# Patient Record
Sex: Female | Born: 1950 | Race: White | Hispanic: No | Marital: Married
Health system: Midwestern US, Community
[De-identification: ages and names within clinical notes are randomized; demographics above are authoritative.]

## PROBLEM LIST (undated history)

## (undated) DIAGNOSIS — R2242 Localized swelling, mass and lump, left lower limb: Secondary | ICD-10-CM

## (undated) DIAGNOSIS — D649 Anemia, unspecified: Secondary | ICD-10-CM

## (undated) DIAGNOSIS — D7589 Other specified diseases of blood and blood-forming organs: Secondary | ICD-10-CM

## (undated) DIAGNOSIS — R300 Dysuria: Secondary | ICD-10-CM

## (undated) DIAGNOSIS — L299 Pruritus, unspecified: Secondary | ICD-10-CM

## (undated) DIAGNOSIS — I2699 Other pulmonary embolism without acute cor pulmonale: Secondary | ICD-10-CM

## (undated) DIAGNOSIS — I4891 Unspecified atrial fibrillation: Secondary | ICD-10-CM

## (undated) HISTORY — PX: CHOLECYSTECTOMY: SHX55

## (undated) HISTORY — PX: ABDOMINAL HYSTERECTOMY: SHX81

## (undated) HISTORY — PX: JOINT REPLACEMENT: SHX530

## (undated) HISTORY — PX: KNEE ARTHROSCOPY: SUR90

---

## 2015-09-25 ENCOUNTER — Emergency Department
Admission: EM | Admit: 2015-09-25 | Discharge: 2015-09-25 | Disposition: A | Discharge status: Home / Self Care | Payer: Managed Care, Other (non HMO) | Attending: Emergency Medicine | Admitting: Emergency Medicine

## 2015-09-25 ENCOUNTER — Emergency Department: Payer: Managed Care, Other (non HMO)

## 2015-09-25 DIAGNOSIS — R609 Edema, unspecified: Secondary | ICD-10-CM

## 2015-09-25 DIAGNOSIS — Z880 Allergy status to penicillin: Secondary | ICD-10-CM | POA: Insufficient documentation

## 2015-09-25 DIAGNOSIS — M79605 Pain in left leg: Secondary | ICD-10-CM | POA: Insufficient documentation

## 2015-09-25 DIAGNOSIS — R2242 Localized swelling, mass and lump, left lower limb: Secondary | ICD-10-CM | POA: Diagnosis present

## 2015-09-25 HISTORY — DX: Unspecified atrial fibrillation: I48.91

## 2015-09-25 HISTORY — DX: Other pulmonary embolism without acute cor pulmonale: I26.99

## 2015-09-25 MED ORDER — CLINDAMYCIN HCL 300 MG PO CAPS: 300.0000 mg | ORAL_CAPSULE | Freq: Three times a day (TID) | ORAL | Status: AC

## 2015-09-25 MED ORDER — OXYCODONE-ACETAMINOPHEN 5-325 MG PO TABS
2.0000 | ORAL_TABLET | Freq: Once | ORAL | Status: AC
  Administered 2015-09-25: 2 via ORAL
  Filled 2015-09-25: qty 2

## 2015-09-25 MED ORDER — CLINDAMYCIN HCL 150 MG PO CAPS
300.0000 mg | ORAL_CAPSULE | Freq: Once | ORAL | Status: AC
  Administered 2015-09-25: 300 mg via ORAL
  Filled 2015-09-25: qty 2

## 2015-09-25 MED ORDER — OXYCODONE-ACETAMINOPHEN 5-325 MG PO TABS: 1.0000 | ORAL_TABLET | Freq: Four times a day (QID) | ORAL | Status: AC | PRN

## 2015-09-25 NOTE — ED Notes (Signed)
Patient transported to Ultrasound 

## 2015-09-25 NOTE — ED Notes (Signed)
Pt c/o left lower leg pain and swelling that started today.. States she has hx of PE in the past

## 2015-09-25 NOTE — Discharge Instructions (Signed)

## 2015-09-25 NOTE — ED Provider Notes (Signed)
[facility] Emergency Department Provider Note  Time seen: 5:57 PM  I have reviewed the triage vital signs and the nursing notes.   HISTORY  Chief Complaint Leg Swelling    HPI Lindsey Hawkins is a 64 y.o. 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 According to the patient she is traveling to [place] from [place]. After the long plane ride she noticed some pain to her legs, the leg is now more swollen and painful. She states occasional swelling in the legs but usually not to this degree. Patient took Lasix today, but came to the emergency department because she was worried this could be a blood clot. Denies any chest pain or shortness of breath. Denies any weakness or numbness. Denies any pain in the right lower extremity.     Past Medical History  Diagnosis Date  . A-fib (HCC)   . PE (pulmonary embolism)     There are no active problems to display for this patient.   Past Surgical History  Procedure Laterality Date  . Joint replacement      TKR  . Abdominal hysterectomy    . Cholecystectomy    . Knee arthroscopy Bilateral     No current outpatient prescriptions on file.  Allergies Penicillins and Sulfa antibiotics  No family history on file.  Social History Social History  Substance Use Topics  . Smoking status: Never Smoker   . Smokeless tobacco: None  . Alcohol Use: Yes    Review of Systems Constitutional: Negative for fever. Cardiovascular: Negative for chest pain. Respiratory: Negative for shortness of breath. Gastrointestinal: Negative for abdominal pain Musculoskeletal: Left lower extremity pain and swelling. Skin: Negative for rash. Neurological: Negative for headache 10-point ROS otherwise  negative.  ____________________________________________   PHYSICAL EXAM:  VITAL SIGNS: ED Triage Vitals  Enc Vitals Group     BP 09/25/15 1634 164/57 mmHg     Pulse Rate 09/25/15 1634 73     Resp 09/25/15 1634 18     Temp 09/25/15 1634 97.9 F (36.6 C)     Temp Source 09/25/15 1634 Oral     SpO2 09/25/15 1634 100 %     Weight 09/25/15 1634 165 lb (74.844 kg)     Height 09/25/15 1634  (1.626 m)     Head Cir --      Peak Flow --      Pain Score 09/25/15 1635 7     Pain Loc --      Pain Edu? --      Excl. in GC? --     Constitutional: Alert and oriented. Well appearing and in no distress. Eyes: Normal exam ENT   Head: Normocephalic and atraumatic.   Mouth/Throat: Mucous membranes are moist. Cardiovascular: Normal rate, regular rhythm. No murmur Respiratory: Normal respiratory effort without tachypnea nor retractions. Breath sounds are clear Gastrointestinal: Soft and nontender. No distention.   Musculoskeletal: Moderate left lower extremity tenderness to palpation. Moderate edema of the left lower extremity. No erythema noted. 2+ DP pulse. Sensation intact. Neurologic:  Normal speech and language. No gross focal neurologic deficit Skin:  Skin is warm, dry and intact.  Psychiatric: Mood and affect are normal. Speech and behavior are normal.   ____________________________________________   RADIOLOGY  Ultrasound shows no DVT  ____________________________________________   INITIAL IMPRESSION / ASSESSMENT  AND PLAN / ED COURSE  Pertinent labs & imaging results that were available during my care of the patient were reviewed by me and considered in my medical decision making (see chart for details).  Patient with left lower extremity swelling and tenderness palpation. Suspect early cellulitis versus DVT. No erythema on exam. Left lower extremity is moderately swollen compared to the right. No fever. Denies chest pain or shortness of breath.  Ultrasound was  negative for DVT. Given the patient's swelling with tenderness to palpation I recommended compression stockings, elevation while at rest, but to remain mobile. We will also prescribe clindamycin to cover for any possible early cellulitis, as the patient has a history of bacteremia in the past. Patient is agreeable to this plan and plans to follow-up with her primary care physician on Wednesday once [place].  ____________________________________________   FINAL CLINICAL IMPRESSION(S) / ED DIAGNOSES  Left lower extremity pain Left lower extremity swelling   Minna AntisKevin Anecia Nusbaum, MD 09/25/15 1806

## 2016-03-07 NOTE — Op Note (Signed)
Operative Report    [facility]  Cindy CascoW. Tyshia Fenter, MD  Service Date: 03/07/2016    SURGEON:  Dr. Alcide GoodnessWorthington.      ASSISTANT:  None.      PREOPERATIVE DIAGNOSIS:  L1 compression fracture.      POSTOPERATIVE DIAGNOSIS:  L1 compression fracture.      PROCEDURE PERFORMED:  Percutaneous vertebral augmentation L1  (kyphoplasty with bone biopsy and fluoroscopy).      OPERATIVE NOTE:  After induction of general endotracheal anesthesia,  the patient was positioned prone on bolsters on the flat top of the  [place] frame.  AP and lateral fluoroscopy was used through the entire  case, they were put in position so as to visualize the L1 vertebral  body.  The upper lumbar region was prepped and draped into a sterile  field with a sky drape.  Local infiltration was carried out at the 2  entry points which were positioned by fluoroscopy just superolateral  to the pedicles on each side at L1.  A stab incision was made on each  side, Kyphon needle was introduced and directed using AP and lateral  fluoro via the pedicle into the vertebral body.  This was accomplished  on each side.  A bone biopsy was taken from the right without  difficulty.  Drilling was carried out on both sides.  Balloon  catheters were placed and the balloons inflated.  Balloons were  deflated and the Kyphon cement was then placed in each side with a  good fill.  When some extravasation was noted on the right, the  procedure was stopped again with a good fill particularly at the  superior endplate.  The cement was tamped down and the Kyphon needles  removed and a suture placed in each stab incision.      ESTIMATED BLOOD LOSS:  Negligible.      SPECIMEN: Bone biopsy L1.      Cindy CascoW. Nayab Aten, MD  TR: *n DD: 03/07/2016 13:24 TD: 03/07/2016 14:32 Job#: [id number]  \\[id number]\\DOC#: [id number]  \\[id number]\\  Signature Line    Electronically Signed on 03/10/2016 06:49 AM EDT  ________________________________________________  Marzella SchleinWORTHINGTON-MD,  Cindy Buck

## 2016-03-07 NOTE — Nursing Note (Signed)
Medication Administration Follow Up-Text       Medication Administration Follow Up Entered On:  03/07/2016 13:51 EDT    Performed On:  03/07/2016 13:50 EDT by Tanna FurryOLLEY, RN, ELLIE D      Intervention Information:     hydromorphone  Performed by Tanna FurryOLLEY, RN, ELLIE D on 03/07/2016 13:35:00 EDT       hydromorphone,0.5mg   IV Push,Forearm, Mid Left,other (see comment)       Medication Effectiveness Evaluation   Medication Administration Reason :   Pain   Medication Effective :   Yes   Medication Response :   Symptoms improved, Continue to observe for symptoms   COLLEY, RN, ELLIE D - 03/07/2016 13:51 EDT

## 2016-03-07 NOTE — Procedures (Signed)
 IntraOp Record - [facility]             IntraOp Record - [facility] Summary                                                                   Primary Physician:        ALVAN BRASIL    Case Number:              [id number]    Finalized Date/Time:      03/07/16 13:04:31    Pt. Name:                 Cindy Buck, Cindy Buck    D.O.B./Sex:               [birth year]    Female    Med Rec #:                [id number]    Physician:                ALVAN BRASIL    Financial #:              [id number]    Pt. Type:                 R    Room/Bed:                 /    Admit/Disch:              03/07/16 09:03:00 -    Institution:       [facility] - Case Times                                                                                                         Entry 1                                                                                                          Patient      In Room Time             03/07/16 11:49:00               Out Room Time                   03/07/16 13:04:00    Anesthesia     Procedure  Start Time               03/07/16 12:30:00               Stop Time                       03/07/16 12:57:00    Last Modified By:         Glendora, RN, Eric                              03/07/16 13:04:18      [facility] - Case Times Audit                                                                          03/07/16 13:04:18         Owner: MADONNA                               Modifier: HASTER                                                        <+> 1         Out Room Time        <+> 1         Stop Time     03/07/16 12:31:06         Owner: RECARDO                               Modifier: ESTEJO                                                        <+> 1         Start Time        [facility] - Safety Checklist - Sign In                                                                                         Entry 1  History/Physical on       Yes                             Procedure Consent               Yes    Chart                                                     on Chart     Site Marked (if           Yes    applicable)     Last Modified By:         ESTES, RN, JOANNA D                              03/07/16 12:13:03      [facility] - Case Attendance                                                                                                    Entry 1                         Entry 2                         Entry 3                                          Case Attendee             FERLA-MD,  BRIAN P              WORTHINGTON-MD,  TOD SHEEN, RN, PHILIPPE D    Role Performed            Anesthesiologist                Surgeon Primary                 Circulator Relief    Time In                   03/07/16 11:49:00               03/07/16 11:49:00               03/07/16 11:49:00    Time Out                  03/07/16 13:04:00               03/07/16 13:04:00               03/07/16 12:30:00  Procedure                 Kyphoplasty                     Kyphoplasty                     Kyphoplasty    Last Modified By:         Glendora, RN, Camellia Glendora, RN, Camellia Glendora, RN, Camellia                              03/07/16 13:04:26               03/07/16 13:04:26               03/07/16 13:04:26                                Entry 4                         Entry 5                         Entry 6                                          Case Attendee             HASTINGS, RN, ERIC R            DEBBY, RN, WAYLAN JAYSON KAPUR, RN, JENNIFER A    Role Performed            Circulator                      Surgical Scrub Relief           Surgical Scrub    Time In                   03/07/16 11:49:00               03/07/16 12:00:00               03/07/16 11:49:00    Time Out                  03/07/16 13:04:00               03/07/16 12:50:00               03/07/16 12:10:00    Procedure                 Kyphoplasty                      Kyphoplasty                     Kyphoplasty    Last Modified By:         Glendora RN, Camellia  Glendora, RN, Camellia Glendora, RN, Camellia                              03/07/16 13:04:26               03/07/16 13:04:26               03/07/16 13:04:26      [facility] - Case Attendance Audit                                                                     03/07/16 13:04:26         Owner: RECARDO                               Modifier: HASTER                                                            1     <+> Time Out            1     <*> Procedure                              Kyphoplasty            2     <+> Time Out            2     <*> Procedure                              Kyphoplasty            3     <*> Procedure                              Kyphoplasty            4     <+> Time Out            4     <*> Procedure                              Kyphoplasty            5     <*> Procedure                              Kyphoplasty            6     <*> Procedure                              Kyphoplasty  03/07/16 12:56:51         Owner: MADONNA                               Modifier: HASTER                                                            3     <+> Time Out            3     <*> Procedure                              Kyphoplasty            5     <+> Time Out            5     <*> Procedure                              Kyphoplasty     03/07/16 12:32:56         Owner: ESTEJO                               Modifier: ESTEJO                                                        <+> 1         Procedure        <+> 2         Procedure            3     <+> Time In            3     <*> Procedure                              Kyphoplasty            4     <+> Time In            4     <*> Procedure                              Kyphoplasty            5     <*> Procedure                              Kyphoplasty            6     <*> Procedure                              Kyphoplasty     03/07/16  12:12:24  Owner: HASTER                               Modifier: ESTEJO                                                        <+> 1         Time In        <+> 2         Time In        <+> 3         Case Attendee        <+> 3         Role Performed        <+> 3         Procedure        <+> 4         Case Attendee        <+> 4         Role Performed        <+> 4         Procedure        <+> 5         Case Attendee        <+> 5         Role Performed        <+> 5         Time In        <+> 5         Procedure        <+> 6         Case Attendee        <+> 6         Role Performed        <+> 6         Time In        <+> 6         Time Out        <+> 6         Procedure        [facility] - Skin Assessment                                                                          Pre-Care Text:            A.240 Assesses baseline skin condition Im.120 Implements protective measures to prevent skin or tissue injury           due to mechanical sources  Im.280.1 Implements progective measures to prevent skin or tissue injury due to           thermal sources Im.360 Monitors for signs and symptons of infection                              Entry 1  Skin Integrity            Intact    Last Modified By:         LISETTE OBIE CRAZE D                              03/07/16 12:17:55    Post-Care Text:            E.10 Evaluates for signs and symptoms of physical injury to skin and tissue E.270 Evaluate tissue perfusion           O.60 Patient is free from signs and symptoms of injury caused by extraneous objects   O.210 Patinet's tissue           perfusion is consistent with or improved from baseline levels      [facility] - Patient Positioning                                                                      Pre-Care Text:            A.240 Assesses baseline skin condition A.280 Identifies baseline musculoskeletal status A.280.1 Identifies            physical alterations that require additional precautions for procedure-specific positioning A.510.8 Maintains           patient's dignity and privacy Im.120 Implements protective measures to prevent skin/tissue injury due to           mechanical sources Im.40 Positions the patient Im.80 Applies safety devices                              Entry 1                                                                                                          Procedure                 Kyphoplasty                     Body Position                   Prone    Left Arm Position         Flexed on Padded Arm            Right Arm Position              Flexed on Padded Arm                              Board w/Security Strap  Board w/Security Strap    Left Leg Position         Extended Security               Right Leg Position              Extended Security                              Strap, Pillow Under                                             Strap, Pillow Under                              Lower Leg                                                       Lower Leg    Feet Uncrossed            Yes                             Pressure Points                 Yes                                                              Checked     Positioning Device        Pillow, Gel Roll, Foam          Positioned By                   FERLA-MD,  BRIAN P,                              Padding, Arm Cradle                                             HASTINGS, RN, ERIC R,                              Foam/Gel, Table                                                 WORTHINGTON-MD,  TOD Mace, Safety Strap    Outcome Met (O.80)  Yes    Last Modified By:         LISETTE OBIE CRAZE D                              03/07/16 12:17:43    Post-Care Text:            A.240 Assesses baseline skin condition A.280 Identifies baseline musculoskeletal status A.280.1 Identifies            physical alterations that require additional precautions for procedure-specific positioning A.510.8 Maintains           patient's dignity and privacy Im.120 Implements protective measures to prevent skin/tissue injury due to           mechanical sources Im.40 Positions the patient Im.80 Applies safety devices      [facility] - Skin Prep                                                                                Pre-Care Text:            A.30 Verifies allergies A.20 Verifies procedure, surgical site, and laterality A.510.8 Maintains paritnet's           dignity and privacy Im.270 Performs Skin Preparation Im.270.1 Implements protective measures to prevent skin           and tissue injury due to chemical sources  A.300.1 Protects from cross-contamination                              Entry 1                                                                                                          Hair Removal     Skin Prep      Prep Agents (Im.270)     Chlorhexidine Gluconate         Prep Area (Im.270)              Back                              2% w/Alcohol     Prep By                  ALVAN BRASIL    Outcome Met (O.100)       Yes    Last Modified By:         Glendora, RN, Camellia  03/07/16 12:51:37    Post-Care Text:            E.10 Evaluates for signs and symptoms of physical injury to skin and tissue O.100 Patient is free from signs           and symptoms of chemical injury  O.740 The patient's right to privacy is maintained      [facility] - Counts Initial and Final                                                                 Pre-Care Text:            A.20.2 - Assesses the risk for unintended retained surgical items Im.20 - Performs required counts                              Entry 1                                                                                                          Initial Counts      Initial Counts           HASTINGS, RN, ERIC R,           Items included in                Sponges, Sharps     Performed By             [employer], RN, JENNIFER A          the Initial Count     Final Counts      Final Counts             HASTINGS, RN, ERIC R,           Final Count Status              Correct     Performed By             SHERON, RN, JENNIFER A     Items Included in        Sponges, Sharps     Final Count     Outcome Met (O.20)        Yes    Last Modified By:         Glendora, RN, Eric                              03/07/16 12:53:20    Post-Care Text:            E.50 - Evaluates results of the surgical count O.20 - Patient is free from unintended retained surgical items      [facility] - Counts Initial and Final Audit  03/07/16 12:53:20         Owner: MADONNA                               Modifier: HASTER                                                        <+> 1         Final Counts Performed By        <+> 1         Final Count Status        <+> 1         Items Included in Final Count        <+> 1         Outcome Met (O.20)        [facility] - General Case Data                                                                        Pre-Care Text:            A.350.1 Classifies surgical wound                              Entry 1                                                                                                          Case Information      ASA Class                2                               Case Level                      Level 3     OR                       RH7 07                          Specialty                       Neurosurgery (SN)     Wound Class              1-Clean    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    Last Modified By:         ESTES, RN, JOANNA D                              03/07/16 12:12:31    Post-Care Text:            O.760 Patient receives consistent and comparable care regardless of the setting      [facility] - Fire Risk Assessment                                                                                                Entry 1                                                                                                          Fire Risk                 Alcohol Based Prep              Fire Risk Score                 2    Assessment: If            Solution, Ignition    checked, checkmark        Source In Use    = 1 point     Last Modified By:         ESTES, RN, JOANNA D                              03/07/16 12:31:44      [facility] - Safety Checklist - Sign Out                                                              Pre-Care Text:            Im.330 Manages specimen handling and disposition                              Entry 1  Patient Safety            Yes    Communication Guide     Used Throughout Case     Last Modified By:         Glendora, RN, Eric                              03/07/16 12:51:43    Post-Care Text:            E.800 Ensures continuity of care E.50 Evaluates results of the surgical count O.30 Patient's procedure is           performed on the correct site, side, and level O.50 patient's current status is communicated throughout the           continuum of care O.40 Patient's specimen(s) is managed in the appropriate manner      [facility] - Patient Care Devices                                                                     Pre-Care Text:            A.200 Assesses risk for normothermia regulation A.40 Verifies presence of prosthetics or corrective devices           Im.280 Implements thermoregulation measures Im.60 Uses supplies and equipment within safe parameters                              Entry 1                         Entry 2                                                                          Equipment Type            MACHINE SEQUENTIAL              BAIR HUGGER                              COMPRESSION    SCD Sleeve Site            Legs Bilateral    Equipment/Tag Number      [id number]                           [id number]    Initiated Pre             Yes    Induction     Last Modified By:         LISETTE, RN, JOANNA D             ESTES, RN, JOANNA D  03/07/16 12:16:42               03/07/16 12:16:42    Post-Care Text:            E.10 Evaluates signs and symptoms of physical injury to skin and tissue O.60 Patient is free from signs and           symptoms of injury caused by extraneous objects      [facility] - Medications                                                                              Pre-Care Text:            A.10 Confirms patient identity A.30 Verifies allergies Im.220 Administers prescribed medications Im.220.2           Administers prescribed antibiotic therapy as ordered                              Entry 1                                                                                                          Time Administered         03/07/16 12:31:00               Medication                      BUPIVACAINE 0.25%                                                                                              EPINEPHRINE INJECTION                                                                                                 Route of Admin            Local Injection  Dose/Volume                                                                                  (include amount and                                                               unit of measure)     Site                      Back                            Site Detail                     Bilateral    Administered By           ALVAN BRASIL         Outcome Met (O.130)             Yes    Last Modified By:         ESTES, RN, JOANNA D                              03/07/16 12:31:35    Post-Care Text:            E.20 Evaluates response to medications O.130 Patient receives appropriately administerd medication(s)      [facility] -  Specimens                                                                                                          Entry 1                                                                                                          Description               L1 BONE BIOPSY  Specimen Type                   Routine    Last Modified By:         Glendora, RN, Eric                              03/07/16 12:39:59      [facility] - Implants/Endoscopy Stents                                                                Pre-Care Text:            A.20 Verifies operative procedure, surgical site, and laterality A.20.1 Verifies consent for planned procedure           Im.350 Records implants inserted during the operative or invasive procedure                              Entry 1                                                                                                          Implant/Explant           Implant                         Catalog #                      [id number]    Implant     Identification      Description              [facility] HV-R BONE CEMENT          Expiration Date                 08/06/18                              AND MIXER C01B     Lot Number               [id number]                         Manufacturer                    [employer]    Usage Data      Implant Site             LUMBAR BACK                     Quantity  1    Last Modified By:         Glendora, RN, Eric                              03/07/16 12:36:47    Post-Care Text:            E.30 Evaluates verification process for correct patient, site, side and level surgery O.30 Patient's procedure           is performed on the correct site, side, and level      [facility] - Implants/Endoscopy Stents Audit                                                           03/07/16 12:36:47         Owner: MADONNA                               Modifier: HASTER                                                            1     <*> Description                             [employer] HV-R BONE CEMENT AND MIXER C01B        [facility] - Dressing/Packing                                                                         Pre-Care Text:            A.350 Assesses susceptibility for infection Im.250 Administers care to invasive devices Im.290 Administer care           to wound sites  Im.300 Implements aseptic technique                              Entry 1                                                                                                          Site                      Back  Dressing Item     Details      Dressing Item            Non-Adhesive Dressing,     (Im.290)                 Band-Aid    Last Modified By:         Glendora, RN, Eric                              03/07/16 12:40:59    Post-Care Text:            E.320 Evaluate factors associted with increased risk for postoperative infection at the completion of the           procedure O.200 Patient's wound perfusion is consistent with or improved from baseline levels  O.Patient is           free from signs and symptoms of infection      [facility] - Procedures                                                                               Pre-Care Text:            A.20 Verifies operative procedure, surgical site, and laterality Im.150 Develops individualized plan of care                              Entry 1                                                                                                          Procedure     Description      Procedure                Kyphoplasty                     Surgical Procedure              KYPHOPLASTY L1 [facility]                                                              Text     Primary Procedure         Yes                             Primary Surgeon  ALVAN BRASIL    Start                     03/07/16 12:30:00               Stop                            03/07/16 12:57:00    Anesthesia Type           General                         Surgical Service                 Neurosurgery (SN)    Wound Class               1-Clean    Last Modified By:         Glendora, RN, Eric                              03/07/16 13:04:20    Post-Care Text:            O.730 The patinet's care is consistent with the individualized perioperative plan of care      [facility] - Procedures Audit                                                                          03/07/16 13:04:20         Owner: ESTEJO                               Modifier: HASTER                                                        <+> 1         Stop        [facility] - Safety Checklist - Time Out                                                              Pre-Care Text:            A.10 Confirms patient identity A.20 Verifies operative procedure, surgical site, and laterality A.20.1 Verifies           consent for planned procedure A.30 Verifies allergies                              Entry 1  Surgical/Procedure        Yes                             Time Out Complete               03/07/16 12:19:00    Team confirms     correct patient,     correct site and     correct procedure     Last Modified By:         LISETTE OBIE CRAZE D                              03/07/16 12:23:11    Post-Care Text:            E.30 Evaluates verification process for correct patient, site, side, and level surgery      [facility] - Transfer                                                                                                           Entry 1                                                                                                          Transferred By            REGINALD REDELL SQUIBB,             Via                             Stretcher                              GLENDORA, RN, ERIC R    Post-op Destination       PACU    Skin Assessment      Condition                Intact    Last Modified By:         GLENDORA, RN, Eric                               03/07/16 12:51:21      Case Comments                                                                                         <  None>              Finalized By: Glendora, RN, Eric      Document Signatures                                                                             Signed By:           Glendora, RN, Eric 03/07/16 13:04

## 2016-03-07 NOTE — Discharge Summary (Signed)
 Inpatient Clinical Summary             [facility]  Post-Acute Care Transfer Instructions  PERSON INFORMATION   Name: Cindy Buck, Cindy Buck   MRN: [id number]    FIN#: [id number]   PHYSICIANS  Admitting Physician: ALVAN BRASIL  Attending Physician: ALVAN BRASIL   PCP: Pcp, None  Discharge Diagnosis:   Comment:       PATIENT EDUCATION INFORMATION  Instructions:             Kyphoplasty, Discharge Instructions; Anesthesia: After Your Surgery  Medication Leaflets:               Follow-up:                          With: Address: When:   [facility] [address], [address]  [place], [place]  [address]  [phone] Business (1)    Comments:   Appointment Scheduled       With: Address: When:   None Pcp                             MEDICATION LIST  Medication Reconciliation at Discharge:         Medications That Have Not Changed  Other Medications  acetaminophen-oxyCODONE (oxyCODONE-acetaminophen 5 mg-325 mg oral tablet range dose) as needed for moderate pain., MAX DAILY DOSE OF ACETAMINOPHEN = 3000 MG  Last Dose:____________________  apixaban (Eliquis 5 mg oral tablet) 1 Tabs Oral (given by mouth) 2 times a day.  Last Dose:____________________  azilsartan (Edarbi 40 mg oral tablet) 1 Tabs Oral (given by mouth) once a day (in the evening).  Last Dose:____________________  furosemide (furosemide 20 mg oral tablet) 1 Tabs Oral (given by mouth) every day as needed.  Last Dose:____________________  LORazepam (LORazepam 1 mg oral tablet) 1 Tabs Oral (given by mouth) 3 times a day as needed for anxiety.  Last Dose:____________________  metoprolol (Metoprolol Succinate ER 25 mg oral tablet, extended release) 0.5 Tabs Oral (given by mouth) once a day (in the morning).  Last Dose:____________________  nitrofurantoin  (Macrobid  100 mg oral capsule) 1 Capsules Oral (given by mouth) 2 times a day for 10 Days. Refills: 0.  Last Dose:____________________  omega-3 polyunsaturated fatty acids (omega-3  polyunsaturated fatty acids ethyl esters 1000 mg oral capsule) 2 Capsules Oral (given by mouth) 2 times a day.  Last Dose:____________________  potassium chloride (potassium chloride 10 mEq oral tablet, extended release) 1 Tabs Oral (given by mouth) 2 times a day as needed.  Last Dose:____________________  rosuvastatin (rosuvastatin 5 mg oral tablet) 1 Tabs Oral (given by mouth) Monday/Wednesday/Friday.  Last Dose:____________________  zolpidem (zolpidem 5 mg oral tablet) 1 Tabs Oral (given by mouth) Once a Day (at bedtime) as needed for insomnia.,  THIS MEDICATION IS ASSOCIATED WITH AN INCREASED RISK OF FALLS.  Last Dose:____________________         Patient's Final Home Medication List Upon Discharge:          acetaminophen-oxyCODONE (oxyCODONE-acetaminophen 5 mg-325 mg oral tablet range dose) as needed for moderate pain., MAX DAILY DOSE OF ACETAMINOPHEN = 3000 MG  apixaban (Eliquis 5 mg oral tablet) 1 Tabs Oral (given by mouth) 2 times a day.  azilsartan (Edarbi 40 mg oral tablet) 1 Tabs Oral (given by mouth) once a day (in the evening).  furosemide (furosemide 20 mg oral tablet) 1 Tabs Oral (given by mouth) every day  as needed.  LORazepam (LORazepam 1 mg oral tablet) 1 Tabs Oral (given by mouth) 3 times a day as needed for anxiety.  metoprolol (Metoprolol Succinate ER 25 mg oral tablet, extended release) 0.5 Tabs Oral (given by mouth) once a day (in the morning).  nitrofurantoin  (Macrobid  100 mg oral capsule) 1 Capsules Oral (given by mouth) 2 times a day for 10 Days. Refills: 0.  omega-3 polyunsaturated fatty acids (omega-3 polyunsaturated fatty acids ethyl esters 1000 mg oral capsule) 2 Capsules Oral (given by mouth) 2 times a day.  potassium chloride (potassium chloride 10 mEq oral tablet, extended release) 1 Tabs Oral (given by mouth) 2 times a day as needed.  rosuvastatin (rosuvastatin 5 mg oral tablet) 1 Tabs Oral (given by mouth) Monday/Wednesday/Friday.  zolpidem (zolpidem 5 mg oral tablet) 1 Tabs Oral  (given by mouth) Once a Day (at bedtime) as needed for insomnia.,  THIS MEDICATION IS ASSOCIATED WITH AN INCREASED RISK OF FALLS.         Comment:       ORDERS         Order Name Order Details   Discharge Patient 03/07/16 14:00:00 EDT, Discharge Home/Self Care

## 2016-03-07 NOTE — Discharge Summary (Signed)
 Inpatient Patient Summary       ;        [facility]  [address]  [place], [place] [address]  [phone]  Patient Discharge Instructions     Name: Cindy Buck, Cindy Buck  Current Date: 03/07/2016 14:10:03  DOB: 31-Dec-1950 MRN: [id number] FIN: [phone]  Patient Address: [address] [place] [place] [address]  Patient Phone: [phone]  Primary Care Provider:  Name: Pcp, None  Phone:    Immunizations Provided:       Discharge Diagnosis:   Discharged To: TO, ANTICIPATED%>  Home Treatments: TREATMENTS, ANTICIPATED%>  Devices/Equipment: EQUIPMENT REHAB%>  Post Hospital Services: HOSPITAL SERVICES%>  Professional Skilled Services: SKILLED SERVICES%>  Therapist, sports and Community Resources:                SERV AND COMM RES, ANTICIPATED%>  Mode of Discharge Transportation: TRANSPORTATION%>  Discharge Orders         Discharge Patient 03/07/16 14:00:00 EDT, Discharge Home/Self Care         Comment:      Medications   During the course of your visit, your medication list was updated with the most current information. The details of those changes are reflected below:         Medications That Have Not Changed  Other Medications  acetaminophen-oxyCODONE (oxyCODONE-acetaminophen 5 mg-325 mg oral tablet range dose) as needed for moderate pain., MAX DAILY DOSE OF ACETAMINOPHEN = 3000 MG  Last Dose:____________________  apixaban (Eliquis 5 mg oral tablet) 1 Tabs Oral (given by mouth) 2 times a day.  Last Dose:____________________  azilsartan (Edarbi 40 mg oral tablet) 1 Tabs Oral (given by mouth) once a day (in the evening).  Last Dose:____________________  furosemide (furosemide 20 mg oral tablet) 1 Tabs Oral (given by mouth) every day as needed.  Last Dose:____________________  LORazepam (LORazepam 1 mg oral tablet) 1 Tabs Oral (given by mouth) 3 times a day as needed for anxiety.  Last Dose:____________________  metoprolol (Metoprolol Succinate ER 25 mg oral tablet, extended release) 0.5 Tabs Oral (given by mouth) once a  day (in the morning).  Last Dose:____________________  nitrofurantoin  (Macrobid  100 mg oral capsule) 1 Capsules Oral (given by mouth) 2 times a day for 10 Days. Refills: 0.  Last Dose:____________________  omega-3 polyunsaturated fatty acids (omega-3 polyunsaturated fatty acids ethyl esters 1000 mg oral capsule) 2 Capsules Oral (given by mouth) 2 times a day.  Last Dose:____________________  potassium chloride (potassium chloride 10 mEq oral tablet, extended release) 1 Tabs Oral (given by mouth) 2 times a day as needed.  Last Dose:____________________  rosuvastatin (rosuvastatin 5 mg oral tablet) 1 Tabs Oral (given by mouth) Monday/Wednesday/Friday.  Last Dose:____________________  zolpidem (zolpidem 5 mg oral tablet) 1 Tabs Oral (given by mouth) Once a Day (at bedtime) as needed for insomnia.,  THIS MEDICATION IS ASSOCIATED WITH AN INCREASED RISK OF FALLS.  Last Dose:____________________         [facility] would like to thank you for allowing us  to assist you with your healthcare needs. The following includes patient education materials and information regarding your injury/illness.     Stroud, Liliauna has been given the following list of follow-up instructions, prescriptions, and patient education materials:  Follow-up Instructions             With: Address: When:   TOD DO [address], [address]  [place], [place]  [address]  [phone] Business (1)    Comments:   Appointment Scheduled  With: Address: When:   None Pcp                         It is important to always keep an active list of medications available so that you can share with other providers and manage your medications appropriately. As an additional courtesy, we are also providing you with your final active medications list that you can keep with you.           acetaminophen-oxyCODONE (oxyCODONE-acetaminophen 5 mg-325 mg oral tablet range dose) as needed for moderate pain., MAX DAILY DOSE OF ACETAMINOPHEN = 3000  MG  apixaban (Eliquis 5 mg oral tablet) 1 Tabs Oral (given by mouth) 2 times a day.  azilsartan (Edarbi 40 mg oral tablet) 1 Tabs Oral (given by mouth) once a day (in the evening).  furosemide (furosemide 20 mg oral tablet) 1 Tabs Oral (given by mouth) every day as needed.  LORazepam (LORazepam 1 mg oral tablet) 1 Tabs Oral (given by mouth) 3 times a day as needed for anxiety.  metoprolol (Metoprolol Succinate ER 25 mg oral tablet, extended release) 0.5 Tabs Oral (given by mouth) once a day (in the morning).  nitrofurantoin  (Macrobid  100 mg oral capsule) 1 Capsules Oral (given by mouth) 2 times a day for 10 Days. Refills: 0.  omega-3 polyunsaturated fatty acids (omega-3 polyunsaturated fatty acids ethyl esters 1000 mg oral capsule) 2 Capsules Oral (given by mouth) 2 times a day.  potassium chloride (potassium chloride 10 mEq oral tablet, extended release) 1 Tabs Oral (given by mouth) 2 times a day as needed.  rosuvastatin (rosuvastatin 5 mg oral tablet) 1 Tabs Oral (given by mouth) Monday/Wednesday/Friday.  zolpidem (zolpidem 5 mg oral tablet) 1 Tabs Oral (given by mouth) Once a Day (at bedtime) as needed for insomnia.,  THIS MEDICATION IS ASSOCIATED WITH AN INCREASED RISK OF FALLS.      Take only the medications listed above. Contact your doctor prior to taking any medications not on this list.        Discharge instructions, if any, will display below     Instructions for Diet: INSTRUCTIONS FOR DIET%>   Instructions for Supplements: SUPPLEMENT INSTRUCTIONS%>   Instructions for Activity: INSTRUCTIONS FOR ACTIVITY%>   Instructions for Wound Care: INSTRUCTIONS FOR WOUND CARE%>     Medication leaflets, if any, will display below         Patient education materials, if any, will display below        Discharge Instructions for Kyphoplasty   Fractures in the bones of the spine (vertebrae) can cause severe back pain and loss of movement. You had a procedure, called kyphoplasty, to cement the fractures in your spine,  restore the height of the vertebrae, and help relieve pain. Using image-guided X-rays, your doctor made two small incisions in your back for each vertebra treated. The doctor inserted a balloon on each side of the broken vertebra and inflated them until they expanded to the desired height. Then the balloons were removed. The spaces created by the balloons were filled with orthopedic cement, giving strength and stability to your vertebra. The following are instructions to help you care for your back when you are at home.   Home care    Take your medication exactly as directed.    Remove the small bandages on your incision 24-48 hours after the surgery.    Dont shower or soak in a bathtub for 1-2 days after the surgery.  Use an ice pack or bag of frozen peas--or something similar--wrapped in a thin towel to reduce the swelling and pain around incision sites. Apply the ice pack for 20 minutes; then remove it for 20 minutes. Repeat as needed.    Wear your brace, if you were told to do so by your doctor. And to help stay flexible, bend as much as the brace allows you to.    For the first 1-2 days after the surgery, keep your head elevated when lying down.    Take short walks. Start by walking for 5 minutes at a time. Then gradually build up your time and distance.    Dont drive for 2 days after surgery. And never drive while taking opioid pain medication.    Dont lift anything heavier than 10 pounds (about the weight of a gallon of milk) for 3 months. After 3 months, you may be ready to increase lifting and return to normal. But speak with your doctor before doing this.   Follow-up   Make a follow-up appointment as directed by our staff.       When to seek medical attention   Call 911 right away if you have any of the following:    Chest pain    Shortness of breath   Otherwise, call your doctor immediately if you have any of the following:    Increased redness, swelling, drainage, or warmth around the  incision sites    Severe pain at the incision site    Weakness, numbness, or tingling in your legs    Fever above 100.97F  (38.0C) or shaking chills      2000-2015 [employer] [address], [place], [place] [address]. All rights reserved. This information is not intended as a substitute for professional medical care. Always follow your healthcare professional's instructions.         Discharge Instructions: After Your Surgery   Youve just had surgery. During surgery you were given medicine called anesthesia to keep you relaxed and free of pain. After surgery you may have some pain or nausea. This is common. Here are some tips for feeling better and getting well after surgery.       Stay on schedule with your medication.    Going home   Your doctor or nurse will show you how to take care of yourself when you go home. He or she will also answer your questions. Have an adult family member or friend drive you home. For the first 24 hours after your surgery:    Do not drive or use heavy equipment.    Do not make important decisions or sign legal papers.    Do not drink alcohol.    Have someone stay with you, if needed. He or she can watch for problems and help keep you safe.   Be sure to go to all follow-up visits with your doctor. And rest after your surgery for as long as your doctor tells you to.   Coping with pain   If you have pain after surgery, pain medicine will help you feel better. Take it as told, before pain becomes severe. Also, ask your doctor or pharmacist about other ways to control pain. This might be with heat, ice, or relaxation. And follow any other instructions your surgeon or nurse gives you.   Tips for taking pain medicine   To get the best relief possible, remember these points:    Pain medicines can upset  your stomach. Taking them with a little food may help.    Most pain relievers taken by mouth need at least 20 to 30 minutes to start to work.    Taking medicine on a  schedule can help you remember to take it. Try to time your medicine so that you can take it before starting an activity. This might be before you get dressed, go for a walk, or sit down for dinner.    Constipation is a common side effect of pain medicines. Call your doctor before taking any medicines such as laxatives or stool softeners to help ease constipation. Also ask if you should skip any foods. Drinking lots of fluids and eating foods such as fruits and vegetables that are high in fiber can also help. Remember, do not take laxatives unless your surgeon has prescribed them.    Drinking alcohol and taking pain medicine can cause dizziness and slow your breathing. It can even be deadly. Do not drink alcohol while taking pain medicine.    Pain medicine can make you react more slowly to things. Do not drive or run machinery while taking pain medicine.   Your health care provider may tell you to take acetaminophen to help ease your pain. Ask him or her how much you are supposed to take each day. Acetaminophen or other pain relievers may interact with your prescription medicines or other over-the-counter (OTC) drugs. Some prescription medicines have acetaminophen and other ingredients. Using both prescription and OTC acetaminophen for pain can cause you to overdose. Read the labels on your OTC medicines with care. This will help you to clearly know the list of ingredients, how much to take, and any warnings. It may also help you not take too much acetaminophen. If you have questions or do not understand the information, ask your pharmacist or health care provider to explain it to you before you take the OTC medicine.   Managing nausea   Some people have an upset stomach after surgery. This is often because of anesthesia, pain, or pain medicine, or the stress of surgery. These tips will help you handle nausea and eat healthy foods as you get better. If you were on a special food plan before surgery, ask your  doctor if you should follow it while you get better. These tips may help:    Do not push yourself to eat. Your body will tell you when to eat and how much.    Start off with clear liquids and soup. They are easier to digest.    Next try semi-solid foods, such as mashed potatoes, applesauce, and gelatin, as you feel ready.    Slowly move to solid foods. Dont eat fatty, rich, or spicy foods at first.    Do not force yourself to have 3 large meals a day. Instead eat smaller amounts more often.    Take pain medicines with a small amount of solid food, such as crackers or toast, to avoid nausea.       Call your surgeon if.    You still have pain an hour after taking medicine. The medicine may not be strong enough.    You feel too sleepy, dizzy, or groggy. The medicine may be too strong.    You have side effects like nausea, vomiting, or skin changes, such as rash, itching, or hives.        If you have obstructive sleep apnea   You were given anesthesia medicine during surgery to keep you  comfortable and free of pain. After surgery, you may have more apnea spells because of this medicine and other medicines you were given. The spells may last longer than usual.    At home:    Keep using the continuous positive airway pressure (CPAP) device when you sleep. Unless your health care provider tells you not to, use it when you sleep, day or night. CPAP is a common device used to treat obstructive sleep apnea.    Talk with your provider before taking any pain medicine, muscle relaxants, or sedatives. Your provider will tell you about the possible dangers of taking these medicines.      [address] [employer] [address], [place], [place] [address]. All rights reserved. This information is not intended as a substitute for professional medical care. Always follow your healthcare professional's instructions.               IS IT A STROKE?  Act FAST and Check for these signs:     FACE                  Does  the face look uneven?     ARM                    Does one arm drift down?     SPEECH             Does their speech sound strange?     TIME                   Call 9-1-1 at any sign of stroke  Heart Attack Signs  Chest discomfort: Most heart attacks involve discomfort in the center of the chest and lasts more than a few minutes, or goes away and comes back. It can feel like uncomfortable pressure, squeezing, fullness or pain.  Discomfort in upper body: Symptoms can include pain or discomfort in one or both arms, back, neck, jaw or stomach.  Shortness of breath: With or without discomfort.  Other signs: Breaking out in a cold sweat, nausea, or lightheaded.  Remember, MINUTES DO MATTER. If you experience any of these heart attack warning signs, call 9-1-1 to get immediate medical attention!             Yes - Patient/Family/Caregiver demonstrates understanding of instructions given  ______________________________ ___________ ___________________ ___________  Patient/Family/ Caregiver Signature Date/Time          Provider Signature Date/Time

## 2016-03-28 NOTE — Procedures (Signed)
Procedure Record - SFPM             Procedure Record - SFPM Summary                                                                 Primary Physician:        Gardiner Rhyme    Case Number:              [id number]    Finalized Date/Time:      03/28/16 81:19:14    Pt. Name:                 Cindy Buck, Cindy Buck    D.O.B./Sex:               [birth year]    Female    Med Rec #:                [id number]    Physician:                Gardiner Rhyme    Financial #:              [id number]    Pt. Type:                 O    Room/Bed:                 /    Admit/Disch:              03/28/16 08:34:00 -    Institution:       [facility] - Case Attendance                                                                                                    Entry 1                         Entry 2                         Entry 3                                          Case Attendee             WOOTEN-MD,  THOMAS JR           Alphonzo Lemmings, RN, ANN Venda Rodes,  MEREDITH    Role Performed            Surgeon Primary                 Monitoring RN  Radiology Tech    Time In     Time Out     Procedure                 Lumbar Epidural Steroid         Lumbar Epidural Steroid         Lumbar Epidural Steroid                              Injection                       Injection                       Injection    Last Modified By:         Alphonzo LemmingsHEBERT, RN, ANN D               Alphonzo LemmingsHEBERT, RN, ANN D               Alphonzo LemmingsHEBERT, RN, ANN D                              03/28/16 09:11:53               03/28/16 09:12:10               03/28/16 09:12:10      SFPM - Case Attendance Audit                                                                     03/28/16 09:12:10         Owner: Fernand ParkinsUPEAN                               Modifier: DUPEAN                                                        <+> 2         Case Attendee        <+> 2         Role Performed        <+> 2         Procedure        <+> 3         Case Attendee        <+> 3         Role  Performed        <+> 3         Procedure        SFPM - Case Times  Entry 1                                                                                                          Patient      In Room Time             03/28/16 09:22:00               Out Room Time                   03/28/16 09:29:00    Anesthesia      Start Time               03/28/16 09:23:00               Stop Time                       03/28/16 09:28:00    Procedure      Start Time               03/28/16 09:23:00               Stop Time                       03/28/16 09:28:00    Last Modified By:         Alphonzo Lemmings RN, ANN D                              03/28/16 09:28:22      SFPM - Case Times Audit                                                                          03/28/16 16:10:96         Owner: Fernand Parkins                               Modifier: DUPEAN                                                        <+> 1         Out Room Time        <+> 1         Stop Time        <+> 1         Stop Time        SFPM - General Case Data  Entry 1                                                                                                          Case Information      ASA Class                N/A                             Case Level                      Level 1     OR                       SF PM 01                        Specialty                       Pain Management (SN)     Wound Class              1-Clean    Preop Diagnosis           M54.16    Last Modified ByAlphonzo Lemmings, RN, ANN D                              03/28/16 09:12:21      SFPM - Procedures                                                                                                         Entry 1                                                                                                           Procedure     Description      Procedure                Lumbar Epidural Steroid  Surgical Procedure              LESI                              Injection                       Text     Primary Procedure         Yes                             Primary Surgeon                 Gardiner Rhyme    Start                     03/28/16 09:23:00               Stop                            03/28/16 09:28:00    Anesthesia Type           Local                           Surgical Service                Pain Management (SN)    Wound Class               1-Clean    Last Modified By:         Alphonzo Lemmings RN, ANN D                              03/28/16 09:28:27      SFPM - Procedures Audit                                                                          03/28/16 45:40:98         Owner: [email or web address]                               Modifier: DUPEAN                                                        <+> 1         Stop     03/28/16 82:95:62         Owner: [email or web address]                               Modifier: DUPEAN                                                        <+>  1         Start        SFPM - Dressing/Packing                                                                                                   Entry 1                                                                                                          Site                      Back    Dressing Item     Details      Dressing Item            Band-Aid     (Im.290)     Last Modified By:         Alphonzo Lemmings, RN, ANN D                              03/28/16 09:12:17      SFPM - Patient Verification                                                                                               Entry 1                                                                                                          Patient Identity          ID band check, Patient          History/Physical on             Yes    Verified (select at       participation  Chart     least 2):     Procedure Consent         Yes                             Site marked with                Yes    on Chart                                                  Initials or                                                               Radiological                                                               guidance     Surgical Site             Yes                             Laterality Verified             Yes    Verified     Last Modified By:         Alphonzo Lemmings, RN, ANN D                              03/28/16 09:12:31      SFPM - Procedure Setup                                                                                                    Entry 1                                                                                                          Body Position             Prone  Prep Agents (Im.270)            Chlorhexidine Gluconate                                                                                              2% w/Alcohol    Skin Prep Agent Dry       Yes                             Equipment Used                  O2 Sat    Without Pooling     Vital signs               Yes    completed and     patient reassessed     prior to sedation     Last Modified By:         Alphonzo Lemmings RN, ANN D                              03/28/16 09:12:36      SFPM - Procedure Time Out                                                                                                 Entry 1                                                                                                          Surgical/Procedure        Yes                             Time Out Complete               03/28/16 09:22:00    Team confirms     correct patient,     correct site and     correct procedure     Last Modified By:         Alphonzo Lemmings, RN, ANN D  03/28/16 09:22:31

## 2016-03-28 NOTE — Nursing Note (Signed)
Nursing Discharge Summary - Text       Physician Discharge Summary Entered On:  03/28/2016 9:39 EDT    Performed On:  03/28/2016 9:39 EDT by Louis MeckelLUTTERS, RN, Liborio NixonJANICE               DC Information   Provider Instructions for Diet :   A Healthy Diet   Provider Instructions for Activity :   As Tolerated, May shower, No Baths/Hot Tubs/Oceans/ or Pools, No bending, twisting or lifting, No driving   Louis MeckelCLUTTERS, RLiborio Nixon, JANICE - 03/28/2016 9:39 EDT

## 2016-03-28 NOTE — Discharge Summary (Signed)
 Inpatient Patient Summary               [facility]  [address]  [place], [place] [address]  [phone]  Patient Discharge Instructions     Name: Cindy Buck, Cindy Buck  Current Date: 03/28/2016 09:39:52  DOB: 08-May-1951 MRN: [id number] FIN: NBR%[phone]  Patient Address: [address] [place] [address]  Patient Phone: [phone]  Primary Care Provider:  Name: Cindy Buck  Phone: [phone]   Immunizations Provided:       Discharge Diagnosis: Lumbar radicular pain  Discharged To: ANTICIPATED%>  Home Treatments: TREATMENTS, ANTICIPATED%>  Devices/Equipment: EQUIPMENT REHAB%>  Post Hospital Services: HOSPITAL SERVICES%>  Professional Skilled Services: SKILLED SERVICES%>  Therapist, sports and Community Resources:                SERV AND COMM RES, ANTICIPATED%>  Mode of Discharge Transportation: TRANSPORTATION%>  Discharge Orders:         Discharge Patient 03/28/16 9:27:00 EDT, Discharge Home/Self Care         Comment:      Medications   During the course of your visit, your medication list was updated with the most current information. The details of those changes are reflected below:         Medications That Were Updated - Follow Current Instructions  Other Medications  Current omega-3 polyunsaturated fatty acids (Omega Essentials) 2 Capsules Oral (given by mouth) 2 times a day.  Last Dose:____________________    Medications that have not changed  Other Medications  acetaminophen (Tylenol 8 HR Arthritis Pain 650 mg oral tablet, extended release) 2 Tabs Oral (given by mouth) as needed., MAX DAILY DOSE OF ACETAMINOPHEN = 3000 MG  Last Dose:____________________  acetaminophen-oxyCODONE (oxyCODONE-acetaminophen 5 mg-325 mg oral tablet range dose) 1 Tabs Oral (given by mouth) as needed for moderate pain., MAX DAILY DOSE OF ACETAMINOPHEN = 3000 MG  Last Dose:____________________  apixaban (Eliquis 5 mg oral tablet) 1 Tabs Oral (given by mouth) 2 times a day.  Last  Dose:____________________  azilsartan (Edarbi 40 mg oral tablet) 1 Tabs Oral (given by mouth) once a day (in the evening).  Last Dose:____________________  cetirizine (ZyrTEC 10 mg oral tablet) 1 Tabs Oral (given by mouth) every day.  Last Dose:____________________  furosemide (furosemide 20 mg oral tablet) 1 Tabs Oral (given by mouth) as needed.  Last Dose:____________________  LORazepam (LORazepam 1 mg oral tablet) 1 Tabs Oral (given by mouth) every day as needed for anxiety.  Last Dose:____________________  metoprolol (Metoprolol Succinate ER 25 mg oral tablet, extended release) 0.5 Tabs Oral (given by mouth) once a day (in the morning).  Last Dose:____________________  potassium chloride (potassium chloride 10 mEq oral tablet, extended release) 1 Tabs Oral (given by mouth) as needed.  Last Dose:____________________  rosuvastatin (rosuvastatin 5 mg oral tablet) 1 Tabs Oral (given by mouth) Monday/Wednesday/Friday.  Last Dose:____________________  zolpidem (zolpidem 5 mg oral tablet) 1 Tabs Oral (given by mouth) Once a Day (at bedtime) as needed for insomnia.,  THIS MEDICATION IS ASSOCIATED WITH AN INCREASED RISK OF FALLS.  Last Dose:____________________         [facility] would like to thank you for allowing us  to assist you with your healthcare needs. The following includes patient education materials and information regarding your injury/illness.     Cindy Buck has been given the following list of follow-up instructions, prescriptions, and patient education materials:  Follow-up Instructions  With: Address: When:   TOD DO [address]  [place], [place]  [address]  [phone] Business (1) , only if needed                       It is important to always keep an active list of medications available so that you can share with other providers and manage your medications appropriately. As an additional courtesy, we are also providing you with your final  active medications list that you can keep with you.           acetaminophen (Tylenol 8 HR Arthritis Pain 650 mg oral tablet, extended release) 2 Tabs Oral (given by mouth) as needed., MAX DAILY DOSE OF ACETAMINOPHEN = 3000 MG  acetaminophen-oxyCODONE (oxyCODONE-acetaminophen 5 mg-325 mg oral tablet range dose) 1 Tabs Oral (given by mouth) as needed for moderate pain., MAX DAILY DOSE OF ACETAMINOPHEN = 3000 MG  apixaban (Eliquis 5 mg oral tablet) 1 Tabs Oral (given by mouth) 2 times a day.  azilsartan (Edarbi 40 mg oral tablet) 1 Tabs Oral (given by mouth) once a day (in the evening).  cetirizine (ZyrTEC 10 mg oral tablet) 1 Tabs Oral (given by mouth) every day.  furosemide (furosemide 20 mg oral tablet) 1 Tabs Oral (given by mouth) as needed.  LORazepam (LORazepam 1 mg oral tablet) 1 Tabs Oral (given by mouth) every day as needed for anxiety.  metoprolol (Metoprolol Succinate ER 25 mg oral tablet, extended release) 0.5 Tabs Oral (given by mouth) once a day (in the morning).  omega-3 polyunsaturated fatty acids (Omega Essentials) 2 Capsules Oral (given by mouth) 2 times a day.  potassium chloride (potassium chloride 10 mEq oral tablet, extended release) 1 Tabs Oral (given by mouth) as needed.  rosuvastatin (rosuvastatin 5 mg oral tablet) 1 Tabs Oral (given by mouth) Monday/Wednesday/Friday.  zolpidem (zolpidem 5 mg oral tablet) 1 Tabs Oral (given by mouth) Once a Day (at bedtime) as needed for insomnia.,  THIS MEDICATION IS ASSOCIATED WITH AN INCREASED RISK OF FALLS.      Take only the medications listed above. Contact your doctor prior to taking any medications not on this list.        Discharge instructions, if any, will display below     Instructions for Diet: INSTRUCTIONS FOR DIET%>A Healthy Diet  Instructions for Supplements: SUPPLEMENT INSTRUCTIONS%>   Instructions for Activity: INSTRUCTIONS FOR ACTIVITY%>As Tolerated, May shower, No Baths/Hot Tubs/Oceans/ or Pools, No bending, twisting or lifting, No driving    Instructions for Wound Care: INSTRUCTIONS FOR WOUND CARE%>     Medication leaflets, if any, will display below         Patient education materials, if any, will display below              IS IT A STROKE?  Act FAST and Check for these signs:     FACE                  Does the face look uneven?     ARM                    Does one arm drift down?     SPEECH             Does their speech sound strange?     TIME                   Call 9-1-1 at any sign  of stroke  Heart Attack Signs  Chest discomfort: Most heart attacks involve discomfort in the center of the chest and lasts more than a few minutes, or goes away and comes back. It can feel like uncomfortable pressure, squeezing, fullness or pain.  Discomfort in upper body: Symptoms can include pain or discomfort in one or both arms, back, neck, jaw or stomach.  Shortness of breath: With or without discomfort.  Other signs: Breaking out in a cold sweat, nausea, or lightheaded.  Remember, MINUTES DO MATTER. If you experience any of these heart attack warning signs, call 9-1-1 to get immediate medical attention!             Yes - Patient/Family/Caregiver demonstrates understanding of instructions given  ______________________________ ___________ ___________________ ___________  Patient/Family/ Caregiver Signature Date/Time          Provider Signature Date/Time

## 2016-03-28 NOTE — Discharge Summary (Signed)
Inpatient Clinical Summary             [facility]  Post-Acute Care Transfer Instructions  PERSON INFORMATION   Name: Cindy Buck, Cindy Buck  MRN: [id number]    FIN#: [id number]   PHYSICIANS  Admitting Physician: Gardiner Rhyme  Attending Physician: Gardiner Rhyme   PCP: Marzella Schlein  Discharge Diagnosis:  Lumbar radicular pain  Comment:       PATIENT EDUCATION INFORMATION  Instructions:               Medication Leaflets:               Follow-up:                          With: Address: When:   [facility] [address], [address]  [place], [place]  [address]  [phone] Business (1) , only if needed                           MEDICATION LIST  Medication Reconciliation at Discharge:         Medications That Were Updated - Follow Current Instructions  Other Medications  Current omega-3 polyunsaturated fatty acids (Omega Essentials) 2 Capsules Oral (given by mouth) 2 times a day.  Last Dose:____________________    Medications that have not changed  Other Medications  acetaminophen (Tylenol 8 HR Arthritis Pain 650 mg oral tablet, extended release) 2 Tabs Oral (given by mouth) as needed., MAX DAILY DOSE OF ACETAMINOPHEN = 3000 MG  Last Dose:____________________  acetaminophen-oxyCODONE (oxyCODONE-acetaminophen 5 mg-325 mg oral tablet range dose) 1 Tabs Oral (given by mouth) as needed for moderate pain., MAX DAILY DOSE OF ACETAMINOPHEN = 3000 MG  Last Dose:____________________  apixaban (Eliquis 5 mg oral tablet) 1 Tabs Oral (given by mouth) 2 times a day.  Last Dose:____________________  azilsartan (Edarbi 40 mg oral tablet) 1 Tabs Oral (given by mouth) once a day (in the evening).  Last Dose:____________________  cetirizine (ZyrTEC 10 mg oral tablet) 1 Tabs Oral (given by mouth) every day.  Last Dose:____________________  furosemide (furosemide 20 mg oral tablet) 1 Tabs Oral (given by mouth) as needed.  Last Dose:____________________  LORazepam (LORazepam 1 mg oral tablet) 1  Tabs Oral (given by mouth) every day as needed for anxiety.  Last Dose:____________________  metoprolol (Metoprolol Succinate ER 25 mg oral tablet, extended release) 0.5 Tabs Oral (given by mouth) once a day (in the morning).  Last Dose:____________________  potassium chloride (potassium chloride 10 mEq oral tablet, extended release) 1 Tabs Oral (given by mouth) as needed.  Last Dose:____________________  rosuvastatin (rosuvastatin 5 mg oral tablet) 1 Tabs Oral (given by mouth) Monday/Wednesday/Friday.  Last Dose:____________________  zolpidem (zolpidem 5 mg oral tablet) 1 Tabs Oral (given by mouth) Once a Day (at bedtime) as needed for insomnia., " THIS MEDICATION IS ASSOCIATED WITH AN INCREASED RISK OF FALLS."  Last Dose:____________________         Patient's Final Home Medication List Upon Discharge:          acetaminophen (Tylenol 8 HR Arthritis Pain 650 mg oral tablet, extended release) 2 Tabs Oral (given by mouth) as needed., MAX DAILY DOSE OF ACETAMINOPHEN = 3000 MG  acetaminophen-oxyCODONE (oxyCODONE-acetaminophen 5 mg-325 mg oral tablet range dose) 1 Tabs Oral (given by mouth) as needed for moderate pain., MAX DAILY DOSE OF ACETAMINOPHEN = 3000 MG  apixaban (Eliquis 5 mg  oral tablet) 1 Tabs Oral (given by mouth) 2 times a day.  azilsartan (Edarbi 40 mg oral tablet) 1 Tabs Oral (given by mouth) once a day (in the evening).  cetirizine (ZyrTEC 10 mg oral tablet) 1 Tabs Oral (given by mouth) every day.  furosemide (furosemide 20 mg oral tablet) 1 Tabs Oral (given by mouth) as needed.  LORazepam (LORazepam 1 mg oral tablet) 1 Tabs Oral (given by mouth) every day as needed for anxiety.  metoprolol (Metoprolol Succinate ER 25 mg oral tablet, extended release) 0.5 Tabs Oral (given by mouth) once a day (in the morning).  omega-3 polyunsaturated fatty acids (Omega Essentials) 2 Capsules Oral (given by mouth) 2 times a day.  potassium chloride (potassium chloride 10 mEq oral tablet, extended release) 1 Tabs Oral  (given by mouth) as needed.  rosuvastatin (rosuvastatin 5 mg oral tablet) 1 Tabs Oral (given by mouth) Monday/Wednesday/Friday.  zolpidem (zolpidem 5 mg oral tablet) 1 Tabs Oral (given by mouth) Once a Day (at bedtime) as needed for insomnia., " THIS MEDICATION IS ASSOCIATED WITH AN INCREASED RISK OF FALLS."         Comment:       ORDERS         Order Name Order Details   Discharge Patient 03/28/16 9:27:00 EDT, Discharge Home/Self Care

## 2016-03-28 NOTE — H&P (Signed)
Preblock Eval and H&P****        Patient:   Cindy Buck, Cindy Buck             MRN: [id number]            FIN: [id number]               Age:   65 years     Sex:  Female     DOB:  1951/10/12   Associated Diagnoses:   None   Author:   Gardiner RhymeWOOTEN-MD,  THOMAS JR      Review of Systems   General:  General health acceptable for todays procedure.    Ear/Nose/Mouth/Throat:  Anatomy adequate for planned procedure.    Respiratory:  Negative.    Cardiovascular:  Adequate for planned procedure.    Vascular:  Deferred, N/A.    Gastrointestinal:  Deferred.    Genitourinary:  Deferred.    Endocrine:  Deferred.    Musculoskeletal:  Per referring MD.    Neurologic:  Per referring MD.       Health Status   Allergies:    Allergic Reactions (Selected)  Unknown  Penicillins- No reactions were documented.  Sulfa drugs- No reactions were documented.   Current medications:    Home Medications (12) Active  Edarbi 40 mg oral tablet 40 mg = 1 tabs, Oral, qPM  Eliquis 5 mg oral tablet 5 mg = 1 tabs, Oral, BID  furosemide 20 mg oral tablet 20 mg = 1 tabs, PRN, Oral  LORazepam 1 mg oral tablet 1 mg = 1 tabs, PRN, Oral, Daily  Metoprolol Succinate ER 25 mg oral tablet, extended release 12.5 mg = 0.5 tabs, Oral, qAM  Omega Essentials 2 caps, Oral, BID  oxyCODONE-acetaminophen 5 mg-325 mg oral tablet range dose 1 tabs, PRN, Oral  potassium chloride 10 mEq oral tablet, extended release 10 mEq = 1 tabs, PRN, Oral  rosuvastatin 5 mg oral tablet 5 mg = 1 tabs, Oral, Mo/We/Fr  Tylenol 8 HR Arthritis Pain 650 mg oral tablet, extended release 1,300 mg = 2 tabs, PRN, Oral  zolpidem 5 mg oral tablet 5 mg = 1 tabs, PRN, Oral, Once a Day (at bedtime)  ZyrTEC 10 mg oral tablet 10 mg = 1 tabs, Oral, Daily  ,    Medications (3) Active  Scheduled: (2)  lidocaine 1% PF Inj Soln 5 mL  5 mL, ID, Once  methylPREDNISolone 80 mg/mL Inj Susp 1 mL  80 mg 1 mL, Epidural, Once  Continuous: (0)  PRN: (1)  midazolam 1 mg/mL Inj Soln 2 mL  1 mg 1 mL, IV Push, q485min     Current medications:   (Selected)   Inpatient Medications  Ordered  Depo-Medrol: 80 mg, 1 mL, Epidural, Once  lidocaine 1% preservative-free injectable solution: 5 mL, ID, Once  midazolam Range Dose: 1 mg, 1 mL, IV Push, q765min, PRN: other (see comment)  Documented Medications  Documented  Edarbi 40 mg oral tablet: 40 mg, 1 tabs, Oral, qPM, 0 Refill(s)  Eliquis 5 mg oral tablet: 5 mg, 1 tabs, Oral, BID, 60 tabs, 0 Refill(s)  LORazepam 1 mg oral tablet: 1 mg, 1 tabs, Oral, Daily, PRN: anxiety, 0 Refill(s)  Metoprolol Succinate ER 25 mg oral tablet, extended release: 12.5 mg, 0.5 tabs, Oral, qAM, 30 tabs, 0 Refill(s)  Omega Essentials: 2 caps, Oral, BID, 0 Refill(s)  Tylenol 8 HR Arthritis Pain 650 mg oral tablet, extended release: 1,300 mg, 2 tabs, Oral, PRN, 0 Refill(s)  ZyrTEC 10  mg oral tablet: 10 mg, 1 tabs, Oral, Daily, 0 Refill(s)  furosemide 20 mg oral tablet: 20 mg, 1 tabs, Oral, PRN, 0 Refill(s)  oxyCODONE-acetaminophen 5 mg-325 mg oral tablet range dose: 1 tabs, Oral, PRN: moderate pain (4-7), 0 Refill(s)  potassium chloride 10 mEq oral tablet, extended release: 10 mEq, 1 tabs, Oral, PRN, 180 tabs, 0 Refill(s)  rosuvastatin 5 mg oral tablet: 5 mg, 1 tabs, Oral, Mo/We/Fr, 0 Refill(s)  zolpidem 5 mg oral tablet: 5 mg, 1 tabs, Oral, Once a Day (at bedtime), PRN: insomnia, 0 Refill(s)   Problem list:    Active Problems (11)  Acute lower UTI (urinary tract infection)   Afib   Anxiety   Apnea, sleep   Arthritis   Cold sores   Hypertension   Low back pain   Lumbar compression fracture   Lumbar radicular pain   PE (pulmonary thromboembolism)      Problem list:    Patient Stated  Acute lower UTI (urinary tract infection) / [id number] / Confirmed  Anxiety / [id number] / Confirmed  Apnea, sleep / [id number] / Confirmed  Arthritis / [id number] / Confirmed  Cold sores / [id number] / Confirmed  Hypertension / [id number] / Confirmed  Low back pain / [id number] / Confirmed  Lumbar compression fracture  / [phone] / Confirmed  Lumbar radicular pain / [id number] / Confirmed      Histories   Past Medical History:    No active or resolved past medical history items have been selected or recorded.   Procedure history:    Kyphoplasty on 03/07/2016 at 64 Years.  Comments:  03/07/2016 13:04 - Aileen Pilot, RN, Eric  auto-populated from documented surgical case  Knee replacement ([id number] DC).  Comments:  03/07/2016 10:07 - GULLEDGE, RN, PATRICIA E  bilateral  Hx of hysterectomy ([id number]).  Breast reduction ([id number]).  History of tonsillectomy [phone]).   Procedure history:    Kyphoplasty on 03/07/2016 at 64 Years.  Comments:  03/07/2016 13:04 - Aileen Pilot, RN, Eric  auto-populated from documented surgical case  Knee replacement ([id number] DC).  Comments:  03/07/2016 10:07 - GULLEDGE, RN, PATRICIA E  bilateral  Hx of hysterectomy ([id number]).  Breast reduction ([id number]).  History of tonsillectomy [phone]).   Social History        Social & Psychosocial Habits    Alcohol  02/27/2016  Use: Current    Frequency: Daily    Substance Abuse  02/27/2016  Use: Denies    Tobacco  02/27/2016  Use: Never smoker  .        Physical Examination      Vital Signs (last 24 hrs)_____  Last Charted___________  Temp Oral     36.5 degC  (MAY 22 08:40)  Resp Rate         18 br/min  (MAY 22 08:40)  SBP      138 mmHg  (MAY 22 08:40)  DBP      68 mmHg  (MAY 22 08:40)  SpO2      99 %  (MAY 22 08:40)  Weight      79.55 kg  (MAY 22 08:40)  Height      162.56 cm  (MAY 22 08:40)     Pain assessment:  Pain Assessment   03/28/2016 8:40 EDT Numeric Rating Pain Scale 6    Primary Pain Location Back    Primary Pain Laterality Bilateral    Primary Pain Quality Aching      .    Respiratory:  Lungs are clear to auscultation.  Cardiovascular:  Satisfactory for the planned injection.     Musculoskeletal     Injection site free of rashes or lesions.     Neurologic:  Alert, Oriented.       Review / Management   Results review:     No qualifying data available.       Impression and Plan   Injection as scheduled   Signature Line     Electronically Signed on 03/28/2016 09:19 AM EDT   ________________________________________________   Gardiner Rhyme

## 2016-03-28 NOTE — Procedures (Signed)
 A LESI Proceedure        Patient:   Cindy Buck, Cindy Buck             MRN: [id number]            FIN: [id number]               Age:   65 years     Sex:  Female     DOB:  Sep 07, 1951   Associated Diagnoses:   None   Author:   JAQUELINE DEBBY RADDLE      Referred by:DR Worthington  Diagnosis M 54.16      Procedure Note: The patient comes to the [facility] for a lumbar epidural steroid injection.  The patient's major complaint is that of left leg pain.  After a  discussion with the patient explaining the procedure, mechanism of action, hoped for benefit and risk involved including the risk of even permanent nerve damage or paralysis, informed consent was obtained.  The patient was taken to the block suite and placed in a prone position.  We then sterilely prepped and draped the area above the injection site.  Using 2 cc's of 1 % Xylocaine a skin wheal and the subcutaneous tissues were infiltrated for a good local anesthetic effect.  Then using an 18 gauge 3 1/2 inch Tuohy epidural needle I introduced it at the L4-5 level guided by biplanar fluoroscopy and loss of resistance technique.  No blood, paresthesias or spinal fluid having been encountered 80 mg of Depomedrol mixed in 1 ml of 1% Xylocaine and 3 ml's of normal saline was injected into the epidural space.  The patient is resting in the post block suite and understands follow up as directed.      Signature Line     Electronically Signed on 03/28/2016 09:29 AM EDT   ________________________________________________   JAQUELINE DEBBY RADDLE Hunt by: JAQUELINE DEBBY RADDLE on 03/28/2016 09:29 AM EDTAddendum by DEVONA LANETA BIRCH on Mar 30, 2016 8:49 EDT               Changed Document from Anesthesia Procedure Note to Procedure Note  Signature Line     Electronically Signed on 03/30/2016 08:49 AM EDT   ________________________________________________   DEVONA LANETA BIRCH               Modified by: DEVONA LANETA D on 03/30/2016 08:49 AM EDT

## 2016-04-18 NOTE — H&P (Signed)
Preblock Eval and H&P****        Patient:   Cindy Buck, Cindy Buck             MRN: [id number]            FIN: [id number]               Age:   65 years     Sex:  Female     DOB:  08-05-1951   Associated Diagnoses:   None   Author:   Gardiner Rhyme      Review of Systems   General:  General health acceptable for todays procedure.    Ear/Nose/Mouth/Throat:  Anatomy adequate for planned procedure.    Respiratory:  Negative.    Cardiovascular:  Adequate for planned procedure.    Vascular:  Deferred, N/A.    Gastrointestinal:  Deferred.    Genitourinary:  Deferred.    Endocrine:  Deferred.    Musculoskeletal:  Per referring MD.    Neurologic:  Per referring MD.       Health Status   Allergies:    Allergic Reactions (Selected)  Unknown  Penicillins- No reactions were documented.  Sulfa drugs- No reactions were documented.   Current medications:    Home Medications (12) Active  Edarbi 40 mg oral tablet 40 mg = 1 tabs, Oral, qPM  Eliquis 5 mg oral tablet 5 mg = 1 tabs, Oral, BID  furosemide 20 mg oral tablet 20 mg = 1 tabs, PRN, Oral  LORazepam 1 mg oral tablet 1 mg = 1 tabs, PRN, Oral, Daily  Metoprolol Succinate ER 25 mg oral tablet, extended release 12.5 mg = 0.5 tabs, Oral, qAM  Omega Essentials 2 caps, Oral, BID  oxyCODONE-acetaminophen 5 mg-325 mg oral tablet range dose 1 tabs, PRN, Oral  potassium chloride 10 mEq oral tablet, extended release 10 mEq = 1 tabs, PRN, Oral  rosuvastatin 5 mg oral tablet 5 mg = 1 tabs, Oral, Mo/We/Fr  Tylenol 8 HR Arthritis Pain 650 mg oral tablet, extended release 1,300 mg = 2 tabs, PRN, Oral  zolpidem 5 mg oral tablet 5 mg = 1 tabs, PRN, Oral, Once a Day (at bedtime)  ZyrTEC 10 mg oral tablet 10 mg = 1 tabs, Oral, Daily  ,    No qualifying data available     Current medications:  (Selected)   Documented Medications  Documented  Edarbi 40 mg oral tablet: 40 mg, 1 tabs, Oral, qPM, 0 Refill(s)  Eliquis 5 mg oral tablet: 5 mg, 1 tabs, Oral, BID, 60 tabs, 0 Refill(s)  LORazepam 1 mg oral  tablet: 1 mg, 1 tabs, Oral, Daily, PRN: anxiety, 0 Refill(s)  Metoprolol Succinate ER 25 mg oral tablet, extended release: 12.5 mg, 0.5 tabs, Oral, qAM, 30 tabs, 0 Refill(s)  Omega Essentials: 2 caps, Oral, BID, 0 Refill(s)  Tylenol 8 HR Arthritis Pain 650 mg oral tablet, extended release: 1,300 mg, 2 tabs, Oral, PRN, 0 Refill(s)  ZyrTEC 10 mg oral tablet: 10 mg, 1 tabs, Oral, Daily, 0 Refill(s)  furosemide 20 mg oral tablet: 20 mg, 1 tabs, Oral, PRN, 0 Refill(s)  oxyCODONE-acetaminophen 5 mg-325 mg oral tablet range dose: 1 tabs, Oral, PRN: moderate pain (4-7), 0 Refill(s)  potassium chloride 10 mEq oral tablet, extended release: 10 mEq, 1 tabs, Oral, PRN, 180 tabs, 0 Refill(s)  rosuvastatin 5 mg oral tablet: 5 mg, 1 tabs, Oral, Mo/We/Fr, 0 Refill(s)  zolpidem 5 mg oral tablet: 5 mg, 1 tabs, Oral, Once a Day (  at bedtime), PRN: insomnia, 0 Refill(s)   Problem list:    Active Problems (11)  Acute lower UTI (urinary tract infection)   Afib   Anxiety   Apnea, sleep   Arthritis   Cold sores   Hypertension   Low back pain   Lumbar compression fracture   Lumbar radicular pain   PE (pulmonary thromboembolism)      Problem list:    Patient Stated  Acute lower UTI (urinary tract infection) / [id number] / Confirmed  Anxiety / [id number] / Confirmed  Apnea, sleep / [id number] / Confirmed  Arthritis / [id number] / Confirmed  Cold sores / 6948546 / Confirmed  Hypertension / [id number] / Confirmed  Low back pain / [id number] / Confirmed  Lumbar compression fracture / [phone] / Confirmed  Lumbar radicular pain / 31540086 / Confirmed      Histories   Past Medical History:    No active or resolved past medical history items have been selected or recorded.   Procedure history:    Lumbar Epidural Steroid Injection on 03/28/2016 at 64 Years.  Comments:  03/28/2016 09:28 - HEBERT, RN, ANN D  auto-populated from documented surgical case  Kyphoplasty on 03/07/2016 at  64 Years.  Comments:  03/07/2016 13:04 - Aileen Pilot, RN, Eric  auto-populated from documented surgical case  Knee replacement ([id number] DC).  Comments:  03/07/2016 10:07 - GULLEDGE, RN, PATRICIA E  bilateral  Hx of hysterectomy ([id number]).  Breast reduction ([id number]).  History of tonsillectomy [phone]).  Cholecystectomy ([phone]).   Procedure history:    Lumbar Epidural Steroid Injection on 03/28/2016 at 64 Years.  Comments:  03/28/2016 09:28 - HEBERT, RN, ANN D  auto-populated from documented surgical case  Kyphoplasty on 03/07/2016 at 64 Years.  Comments:  03/07/2016 13:04 - Aileen Pilot, RN, Eric  auto-populated from documented surgical case  Knee replacement ([id number] DC).  Comments:  03/07/2016 10:07 - GULLEDGE, RN, PATRICIA E  bilateral  Hx of hysterectomy ([id number]).  Breast reduction ([id number]).  History of tonsillectomy [phone]).  Cholecystectomy ([phone]).   Social History        Social & Psychosocial Habits    Alcohol  02/27/2016  Use: Current    Frequency: Daily    Substance Abuse  02/27/2016  Use: Denies    Tobacco  02/27/2016  Use: Never smoker  .        Physical Examination      Vital Signs (last 24 hrs)_____  Last Charted___________  Temp Oral     36.5 degC  (JUN 12 11:00)  Resp Rate         18 br/min  (JUN 12 11:00)  SBP      131 mmHg  (JUN 12 11:00)  DBP      60 mmHg  (JUN 12 11:00)  SpO2      100 %  (JUN 12 11:00)     Pain assessment:  Pain Assessment   04/18/2016 11:00 EDT Numeric Rating Pain Scale 4    Primary Pain Location Lumbar    Primary Pain Laterality Bilateral    Primary Pain Quality Aching, Cramping, Discomfort, Radiating      .    Respiratory:  Lungs are clear to auscultation.    Cardiovascular:  Satisfactory for the planned injection.    Musculoskeletal     Injection site free of rashes or lesions.     Neurologic:  Alert, Oriented.        Review / Management   Results review:     No qualifying data available.  Impression and Plan   Injection as scheduled   Signature Line     Electronically Signed on 04/18/2016 11:37 AM EDT   ________________________________________________   Gardiner Rhyme

## 2016-04-18 NOTE — Discharge Summary (Signed)
Inpatient Patient Summary       ;        [facility]  [address]  [place], [place] [address]  [phone]  Patient Discharge Instructions     Name: Cindy Buck, Cindy Buck  Current Date: 04/18/2016 11:59:51  DOB: [birth year] MRN: [id number] FIN: [id number]%>[id number]  Patient Address: [address] [place] [address]  Patient Phone: [phone]  Primary Care Provider:  Name: Cindy Buck  Phone: [phone]   Immunizations Provided:       Discharge Diagnosis: Lumbar radicular pain  Discharged To: ANTICIPATED%>  Home Treatments: TREATMENTS, ANTICIPATED%>  Devices/Equipment: EQUIPMENT REHAB%>  Post Hospital Services: HOSPITAL SERVICES%>  Professional Skilled Services: SKILLED SERVICES%>  Therapist, sports and Community Resources:                SERV AND COMM RES, ANTICIPATED%>  Mode of Discharge Transportation: TRANSPORTATION%>  Discharge Orders:         Discharge Patient 04/18/16 11:46:00 EDT, Discharge Home/Self Care         Comment:      Medications   During the course of your visit, your medication list was updated with the most current information. The details of those changes are reflected below:         Medications that have not changed  Other Medications  acetaminophen (Tylenol 8 HR Arthritis Pain 650 mg oral tablet, extended release) 2 Tabs Oral (given by mouth) as needed., MAX DAILY DOSE OF ACETAMINOPHEN = 3000 MG  Last Dose:____________________  acetaminophen-oxyCODONE (oxyCODONE-acetaminophen 5 mg-325 mg oral tablet range dose) 1 Tabs Oral (given by mouth) as needed for moderate pain., MAX DAILY DOSE OF ACETAMINOPHEN = 3000 MG  Last Dose:____________________  apixaban (Eliquis 5 mg oral tablet) 1 Tabs Oral (given by mouth) 2 times a day.  Last Dose:____________________  azilsartan (Edarbi 40 mg oral tablet) 1 Tabs Oral (given by mouth) once a day (in the evening).  Last Dose:____________________  cetirizine (ZyrTEC 10 mg oral tablet) 1 Tabs Oral (given by mouth) every day.  Last  Dose:____________________  furosemide (furosemide 20 mg oral tablet) 1 Tabs Oral (given by mouth) as needed.  Last Dose:____________________  LORazepam (LORazepam 1 mg oral tablet) 1 Tabs Oral (given by mouth) every day as needed for anxiety.  Last Dose:____________________  metoprolol (Metoprolol Succinate ER 25 mg oral tablet, extended release) 0.5 Tabs Oral (given by mouth) once a day (in the morning).  Last Dose:____________________  omega-3 polyunsaturated fatty acids (Omega Essentials) 2 Capsules Oral (given by mouth) 2 times a day.  Last Dose:____________________  potassium chloride (potassium chloride 10 mEq oral tablet, extended release) 1 Tabs Oral (given by mouth) as needed.  Last Dose:____________________  rosuvastatin (rosuvastatin 5 mg oral tablet) 1 Tabs Oral (given by mouth) Monday/Wednesday/Friday.  Last Dose:____________________  zolpidem (zolpidem 5 mg oral tablet) 1 Tabs Oral (given by mouth) Once a Day (at bedtime) as needed for insomnia., " THIS MEDICATION IS ASSOCIATED WITH AN INCREASED RISK OF FALLS."  Last Dose:____________________         [facility] would like to thank you for allowing [place] to assist you with your healthcare needs. The following includes patient education materials and information regarding your injury/illness.     Cindy Buck has been given the following list of follow-up instructions, prescriptions, and patient education materials:  Follow-up Instructions             With: Address: When:   Cindy Buck [address], [address]  [place], [place]  [address]  [phone] Business (1)                        It is important to always keep an active list of medications available so that you can share with other providers and manage your medications appropriately. As an additional courtesy, we are also providing you with your final active medications list that you can keep with you.           acetaminophen (Tylenol 8 HR Arthritis Pain 650 mg oral tablet,  extended release) 2 Tabs Oral (given by mouth) as needed., MAX DAILY DOSE OF ACETAMINOPHEN = 3000 MG  acetaminophen-oxyCODONE (oxyCODONE-acetaminophen 5 mg-325 mg oral tablet range dose) 1 Tabs Oral (given by mouth) as needed for moderate pain., MAX DAILY DOSE OF ACETAMINOPHEN = 3000 MG  apixaban (Eliquis 5 mg oral tablet) 1 Tabs Oral (given by mouth) 2 times a day.  azilsartan (Edarbi 40 mg oral tablet) 1 Tabs Oral (given by mouth) once a day (in the evening).  cetirizine (ZyrTEC 10 mg oral tablet) 1 Tabs Oral (given by mouth) every day.  furosemide (furosemide 20 mg oral tablet) 1 Tabs Oral (given by mouth) as needed.  LORazepam (LORazepam 1 mg oral tablet) 1 Tabs Oral (given by mouth) every day as needed for anxiety.  metoprolol (Metoprolol Succinate ER 25 mg oral tablet, extended release) 0.5 Tabs Oral (given by mouth) once a day (in the morning).  omega-3 polyunsaturated fatty acids (Omega Essentials) 2 Capsules Oral (given by mouth) 2 times a day.  potassium chloride (potassium chloride 10 mEq oral tablet, extended release) 1 Tabs Oral (given by mouth) as needed.  rosuvastatin (rosuvastatin 5 mg oral tablet) 1 Tabs Oral (given by mouth) Monday/Wednesday/Friday.  zolpidem (zolpidem 5 mg oral tablet) 1 Tabs Oral (given by mouth) Once a Day (at bedtime) as needed for insomnia., " THIS MEDICATION IS ASSOCIATED WITH AN INCREASED RISK OF FALLS."      Take only the medications listed above. Contact your doctor prior to taking any medications not on this list.        Discharge instructions, if any, will display below     Instructions for Diet: INSTRUCTIONS FOR DIET%>A Healthy Diet  Instructions for Supplements: SUPPLEMENT INSTRUCTIONS%>   Instructions for Activity: INSTRUCTIONS FOR ACTIVITY%>Gradually resume your normal activity, Limit your activity for 24hrs, May shower, No Baths/Hot Tubs/Oceans/ or Pools, No bending, No bending, twisting or lifting, No driving, No lifting   Instructions for Wound Care: INSTRUCTIONS  FOR WOUND CARE%>     Medication leaflets, if any, will display below         Patient education materials, if any, will display below        Lumbar Epidural Injection: Recovery at Home   You dont need to stay in bed when you get home. In fact, its best to walk around if you feel up to it. Just be careful about being too active. Even if you feel better right away, avoid activities that may strain your back. And follow up on all treatment with your doctor.   What to know about pain relief   Keep in mind that some patients feel increased pain at first. It usually goes away within a few days. You may also have headaches or trouble sleeping, but if these symptoms are severe, you should call your doctor right away. These should also go away within a few days. In general:    An injection  to reduce inflammation takes a few days to work, sometimes even up to a week. There may even be more pain at first.    An injection to help locate the source of pain may give only brief pain relief. Later, youll feel the same as you did before the injection.   Tips for recovery   Whether you were injected for pain relief or diagnosis, these tips will help you recover:    Take walks when you feel up to it.    Rest if needed, but get up and move around after sitting for half an hour.    Dont exercise vigorously.    Dont drive the day of the procedure or until your doctor says its OK.    Return to work or other activities when your doctor says youre ready.   When to call your doctor   Call right away if you notice any of the following symptoms:    Severe pain or headache    Loss of bladder or bowel control    Fever or chills    Redness or swelling around the injection site           2000-2015 [employer] [address] [place], [place] [address]. All rights reserved. This information is not intended as a substitute for professional medical care. Always follow your healthcare professional's instructions.                IS IT A STROKE?  Act FAST and Check for these signs:     FACE                  Does the face look uneven?     ARM                    Does one arm drift down?     SPEECH             Does their speech sound strange?     TIME                   Call 9-1-1 at any sign of stroke  Heart Attack Signs  Chest discomfort: Most heart attacks involve discomfort in the center of the chest and lasts more than a few minutes, or goes away and comes back. It can feel like uncomfortable pressure, squeezing, fullness or pain.  Discomfort in upper body: Symptoms can include pain or discomfort in one or both arms, back, neck, jaw or stomach.  Shortness of breath: With or without discomfort.  Other signs: Breaking out in a cold sweat, nausea, or lightheaded.  Remember, MINUTES DO MATTER. If you experience any of these heart attack warning signs, call 9-1-1 to get immediate medical attention!             Yes - Patient/Family/Caregiver demonstrates understanding of instructions given  ______________________________ ___________ ___________________ ___________  Patient/Family/ Caregiver Signature Date/Time          Provider Signature Date/Time

## 2016-04-18 NOTE — Discharge Summary (Signed)
 Inpatient Clinical Summary             [facility]  Post-Acute Care Transfer Instructions  PERSON INFORMATION   Name: Cindy Buck, Cindy Buck  MRN: [id number]    FIN#: [id number]   PHYSICIANS  Admitting Physician: JAQUELINE DEBBY RADDLE  Attending Physician: JAQUELINE DEBBY RADDLE   PCP: ALVAN BRASIL  Discharge Diagnosis:  Lumbar radicular pain  Comment:       PATIENT EDUCATION INFORMATION  Instructions:             Lumbar Epidural Injection: Recovery at Home  Medication Leaflets:               Follow-up:                          With: Address: When:   CURTIS WORTHINGTON-MD [address], [address]  [place], [place]  [address]  [phone] Business (1)                            MEDICATION LIST  Medication Reconciliation at Discharge:         Medications that have not changed  Other Medications  acetaminophen (Tylenol 8 HR Arthritis Pain 650 mg oral tablet, extended release) 2 Tabs Oral (given by mouth) as needed., MAX DAILY DOSE OF ACETAMINOPHEN = 3000 MG  Last Dose:____________________  acetaminophen-oxyCODONE (oxyCODONE-acetaminophen 5 mg-325 mg oral tablet range dose) 1 Tabs Oral (given by mouth) as needed for moderate pain., MAX DAILY DOSE OF ACETAMINOPHEN = 3000 MG  Last Dose:____________________  apixaban (Eliquis 5 mg oral tablet) 1 Tabs Oral (given by mouth) 2 times a day.  Last Dose:____________________  azilsartan (Edarbi 40 mg oral tablet) 1 Tabs Oral (given by mouth) once a day (in the evening).  Last Dose:____________________  cetirizine (ZyrTEC 10 mg oral tablet) 1 Tabs Oral (given by mouth) every day.  Last Dose:____________________  furosemide (furosemide 20 mg oral tablet) 1 Tabs Oral (given by mouth) as needed.  Last Dose:____________________  LORazepam (LORazepam 1 mg oral tablet) 1 Tabs Oral (given by mouth) every day as needed for anxiety.  Last Dose:____________________  metoprolol (Metoprolol Succinate ER 25 mg oral tablet, extended release) 0.5 Tabs Oral (given by mouth) once  a day (in the morning).  Last Dose:____________________  omega-3 polyunsaturated fatty acids (Omega Essentials) 2 Capsules Oral (given by mouth) 2 times a day.  Last Dose:____________________  potassium chloride (potassium chloride 10 mEq oral tablet, extended release) 1 Tabs Oral (given by mouth) as needed.  Last Dose:____________________  rosuvastatin (rosuvastatin 5 mg oral tablet) 1 Tabs Oral (given by mouth) Monday/Wednesday/Friday.  Last Dose:____________________  zolpidem (zolpidem 5 mg oral tablet) 1 Tabs Oral (given by mouth) Once a Day (at bedtime) as needed for insomnia.,  THIS MEDICATION IS ASSOCIATED WITH AN INCREASED RISK OF FALLS.  Last Dose:____________________         Patient's Final Home Medication List Upon Discharge:          acetaminophen (Tylenol 8 HR Arthritis Pain 650 mg oral tablet, extended release) 2 Tabs Oral (given by mouth) as needed., MAX DAILY DOSE OF ACETAMINOPHEN = 3000 MG  acetaminophen-oxyCODONE (oxyCODONE-acetaminophen 5 mg-325 mg oral tablet range dose) 1 Tabs Oral (given by mouth) as needed for moderate pain., MAX DAILY DOSE OF ACETAMINOPHEN = 3000 MG  apixaban (Eliquis 5 mg oral tablet) 1 Tabs Oral (given by mouth) 2 times a day.  azilsartan (Edarbi 40 mg oral tablet) 1 Tabs Oral (given by mouth) once a day (in the evening).  cetirizine (ZyrTEC 10 mg oral tablet) 1 Tabs Oral (given by mouth) every day.  furosemide (furosemide 20 mg oral tablet) 1 Tabs Oral (given by mouth) as needed.  LORazepam (LORazepam 1 mg oral tablet) 1 Tabs Oral (given by mouth) every day as needed for anxiety.  metoprolol (Metoprolol Succinate ER 25 mg oral tablet, extended release) 0.5 Tabs Oral (given by mouth) once a day (in the morning).  omega-3 polyunsaturated fatty acids (Omega Essentials) 2 Capsules Oral (given by mouth) 2 times a day.  potassium chloride (potassium chloride 10 mEq oral tablet, extended release) 1 Tabs Oral (given by mouth) as needed.  rosuvastatin (rosuvastatin 5 mg oral  tablet) 1 Tabs Oral (given by mouth) Monday/Wednesday/Friday.  zolpidem (zolpidem 5 mg oral tablet) 1 Tabs Oral (given by mouth) Once a Day (at bedtime) as needed for insomnia.,  THIS MEDICATION IS ASSOCIATED WITH AN INCREASED RISK OF FALLS.         Comment:       ORDERS         Order Name Order Details   Discharge Patient 04/18/16 11:46:00 EDT, Discharge Home/Self Care

## 2016-04-18 NOTE — Procedures (Signed)
 A LESI Proceedure        Patient:   Cindy Buck, Cindy Buck             MRN: [id number]            FIN: [id number]               Age:   65 years     Sex:  Female     DOB:  10/29/1951   Associated Diagnoses:   None   Author:   JAQUELINE DEBBY RADDLE      Referred by:DR Worthington  Diagnosis M 54.16      Procedure Note: The patient comes to the [facility] for a lumbar epidural steroid injection.  The patient's major complaint is that of left leg pain.  After a  discussion with the patient explaining the procedure, mechanism of action, hoped for benefit and risk involved including the risk of even permanent nerve damage or paralysis, informed consent was obtained.  The patient was taken to the block suite and placed in a prone position.  We then sterilely prepped and draped the area above the injection site.  Using 2 cc's of 1 % Xylocaine a skin wheal and the subcutaneous tissues were infiltrated for a good local anesthetic effect.  Then using an 18 gauge 3 1/2 inch Tuohy epidural needle I introduced it at the L4-5 level guided by biplanar fluoroscopy and loss of resistance technique.  No blood, paresthesias or spinal fluid having been encountered 80 mg of Depomedrol mixed in 1 ml of 1% Xylocaine and 3 ml's of normal saline was injected into the epidural space.  The patient is resting in the post block suite and understands follow up as directed.      Signature Line     Electronically Signed on 04/18/2016 11:48 AM EDT   ________________________________________________   JAQUELINE DEBBY JRAddendum by SHONA ADRIEN SAUNDERS on April 20, 2016 8:43 EDT               Changing document type from Anesthesia Procedure Note to Procedure Note  Signature Line     Electronically Signed on 04/20/2016 08:43 AM EDT   ________________________________________________   SHONA ADRIEN R               Modified by: SHONA ADRIEN R on 04/20/2016 08:43 AM EDT

## 2016-04-18 NOTE — Nursing Note (Signed)
Nursing Discharge Summary - Text       Physician Discharge Summary Entered On:  04/18/2016 11:58 EDT    Performed On:  04/18/2016 11:58 EDT by Durene CalHUNTER, RN, [facility] LLCDEBORAH L               DC Information   Provider Instructions for Diet :   A Healthy Diet   Provider Instructions for Activity :   Gradually resume your normal activity, Limit your activity for 24hrs, May shower, No Baths/Hot Tubs/Oceans/ or Pools, No bending, No bending, twisting or lifting, No driving, No lifting   Durene CalHUNTER, RN, DEBORAH L - 04/18/2016 11:58 EDT

## 2016-04-18 NOTE — Procedures (Signed)
Procedure Record - SFPM             Procedure Record - SFPM Summary                                                                 Primary Physician:        Gardiner RhymeWOOTEN-MD,  THOMAS JR    Case Number:              [id number]    Finalized Date/Time:      04/18/16 11:56:10    Pt. Name:                 Cindy Buck, Cindy Buck    D.O.B./Sex:               [birth year]    Female    Med Rec #:                [id number]    Physician:                Gardiner RhymeWOOTEN-MD,  THOMAS JR    Financial #:              [id number]    Pt. Type:                 O    Room/Bed:                 /    Admit/Disch:              04/18/16 10:50:00 -    Institution:       [id number] - Case Attendance                                                                                                    Entry 1                         Entry 2                         Entry 3                                          Case Attendee             WOOTEN-MD,  THOMAS JR           MCVICKER, RN, Soyla DryerVERONICA          SPRINGFIELD,  JOY L    Role Performed            Surgeon Primary                 Monitoring RN  Radiology Tech    Time In                   04/18/16 11:41:00               04/18/16 11:40:00               04/18/16 11:40:00    Time Out                  04/18/16 11:51:00               04/18/16 11:51:00               04/18/16 11:51:00    Procedure                 Lumbar Epidural Steroid         Lumbar Epidural Steroid         Lumbar Epidural Steroid                              Injection                       Injection                       Injection    Last Modified By:         MCVICKER, RN, VERONICA          MCVICKER, RN, VERONICA          MCVICKER, RN, VERONICA                              04/18/16 11:56:08               04/18/16 11:56:08               04/18/16 11:56:08      SFPM - Case Attendance Audit                                                                     04/18/16 11:56:08         Owner: MCVIVE                               Modifier: MCVIVE                                                             1     <+> Time Out            1     <*> Procedure                              Lumbar Epidural Steroid Injection            2     <+> Time Out  2     <*> Procedure                              Lumbar Epidural Steroid Injection            3     <+> Time Out            3     <*> Procedure                              Lumbar Epidural Steroid Injection     04/18/16 11:42:36         Owner: MCVIVE                               Modifier: MCVIVE                                                            1     <+> Time In            1     <*> Procedure                              Lumbar Epidural Steroid Injection        <+> 2         Case Attendee        <+> 2         Role Performed        <+> 2         Time In        <+> 2         Procedure        <+> 3         Case Attendee        <+> 3         Role Performed        <+> 3         Time In        <+> 3         Procedure        SFPM - Case Times                                                                                                         Entry 1  Patient      In Room Time             04/18/16 11:41:00               Out Room Time                   04/18/16 11:51:00    Anesthesia      Start Time               04/18/16 11:45:00               Stop Time                       04/18/16 11:48:00    Procedure      Start Time               04/18/16 11:45:00               Stop Time                       04/18/16 11:48:00    Last Modified By:         MCVICKER, RN, VERONICA                              04/18/16 11:56:05      SFPM - Case Times Audit                                                                          04/18/16 11:56:05         Owner: MCVIVE                               Modifier: MCVIVE                                                        <+> 1         Out Room Time     04/18/16 11:48:58          Owner: MCVIVE                               Modifier: MCVIVE                                                        <+> 1         Stop Time        <+> 1         Stop Time     04/18/16 11:45:41         Owner: MCVIVE  Modifier: MCVIVE                                                        <+> 1         Start Time        <+> 1         Start Time        SFPM - General Case Data                                                                                                  Entry 1                                                                                                          Case Information      ASA Class                N/A                             Case Level                      Level 1     OR                       SF PM 01                        Specialty                       Pain Management (SN)     Wound Class              1-Clean    Preop Diagnosis           M54.16    Last Modified ByMargaretmary Dys, RN, VERONICA                              04/18/16 11:49:06      SFPM - General Case Data Audit  04/18/16 11:49:06         Owner: MCVIVE                               Modifier: MCVIVE                                                        <+> 1         ASA Class        SFPM - Procedures                                                                                                         Entry 1                                                                                                          Procedure     Description      Procedure                Lumbar Epidural Steroid         Surgical Procedure              LESI                              Injection                       Text     Primary Procedure         Yes                             Primary Surgeon                 Gardiner Rhyme    Start                     04/18/16 11:45:00               Stop                            04/18/16  11:48:00    Anesthesia Type           Local  Surgical Service                Pain Management (SN)    Wound Class               1-Clean    Last Modified ByMargaretmary Dys, RN, VERONICA                              04/18/16 11:49:10      SFPM - Dressing/Packing                                                                                                   Entry 1                                                                                                          Site                      Back    Dressing Item     Details      Dressing Item            Band-Aid     (Im.290)     Last Modified ByMargaretmary Dys, RN, VERONICA                              04/18/16 11:49:23      SFPM - Patient Verification                                                                                               Entry 1  Patient Identity          ID band check, Patient          History/Physical on             Yes    Verified (select at       participation                   Chart     least 2):     Procedure Consent         Yes                             Site marked with                Yes    on Chart                                                  Initials or                                                               Radiological                                                               guidance     Surgical Site             Yes                             Laterality Verified             Yes    Verified     Last Modified ByMargaretmary Dys, RN, VERONICA                              04/18/16 11:49:33      SFPM - Procedure Setup                                                                                                    Entry 1  Body Position             Prone                            Prep Agents (Im.270)            Povidone Iodine                                                                                              Solution 10%    Skin Prep Agent Dry       Yes                             Equipment Used                  O2 Sat    Without Pooling     Vital signs               Yes    completed and     patient reassessed     prior to sedation     Last Modified ByMargaretmary Dys, RN, VERONICA                              04/18/16 11:49:42      SFPM - Procedure Time Out                                                                                                 Entry 1                                                                                                          Surgical/Procedure        Yes                             Time Out Complete               04/18/16 11:43:00    Team confirms     correct patient,     correct site and  correct procedure     Last Modified ByMargaretmary Dys, RN, VERONICA                              04/18/16 11:45:09

## 2017-09-03 IMAGING — US US EXTREM LOW VENOUS*L*
1 series · 13 of 24 positions shown · non-contrast
Comparison: None.

CLINICAL DATA: Patient with left leg pain and swelling for 1 day.



[Series 1: us extrem low venous*left* · 0.08mm/px · 13 of 44 slices shown]
[im 1/44]
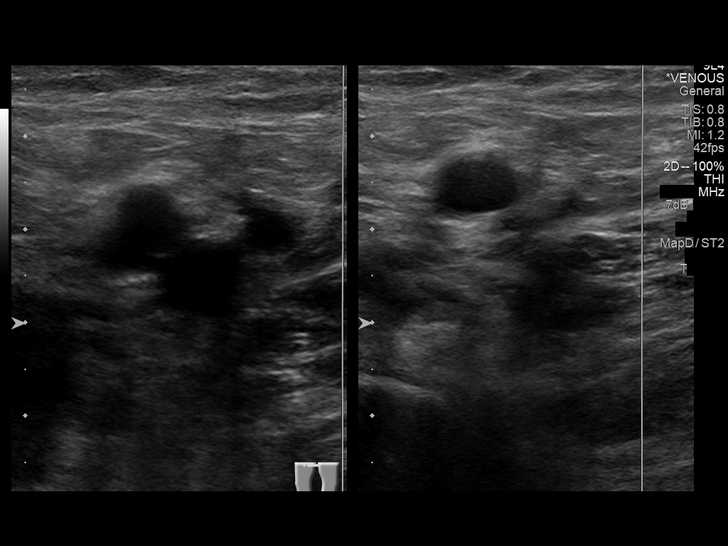
[im 4/44]
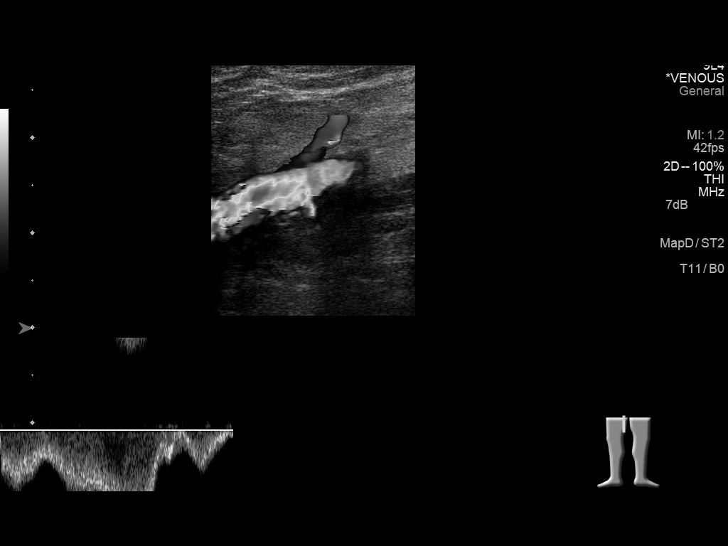
[im 8/44]
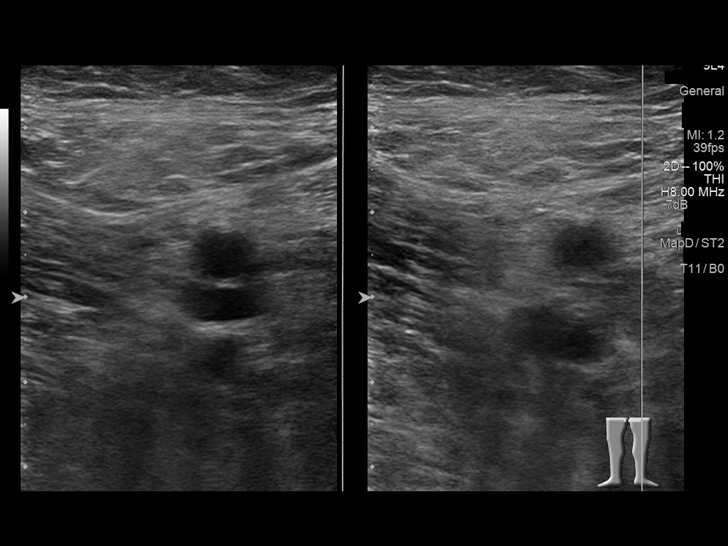
[im 12/44]
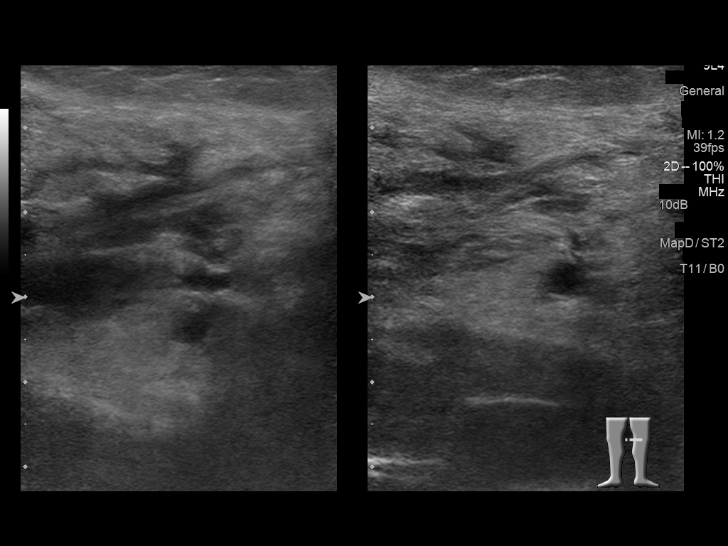
[im 15/44]
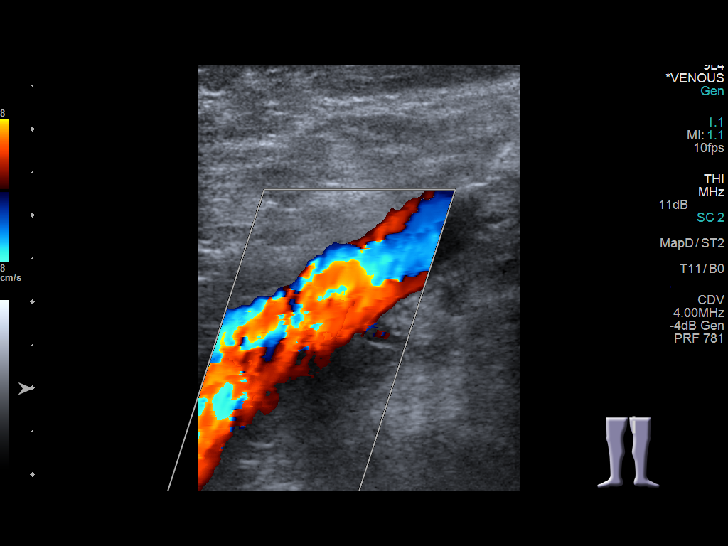
[im 19/44]
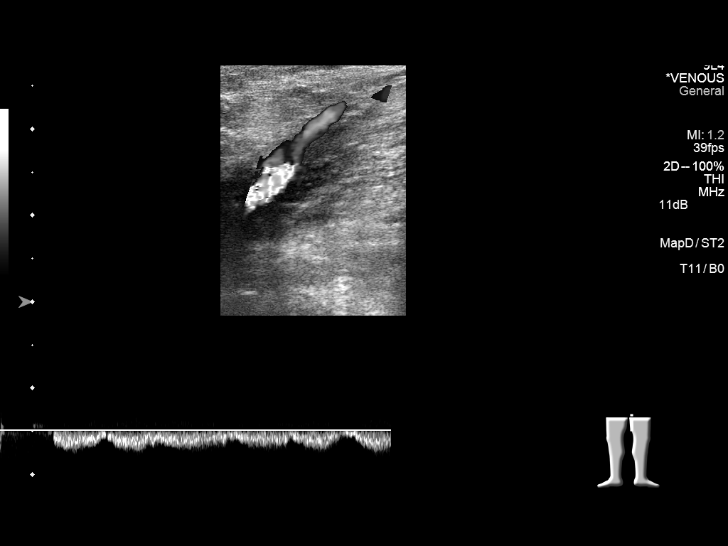
[im 23/44]
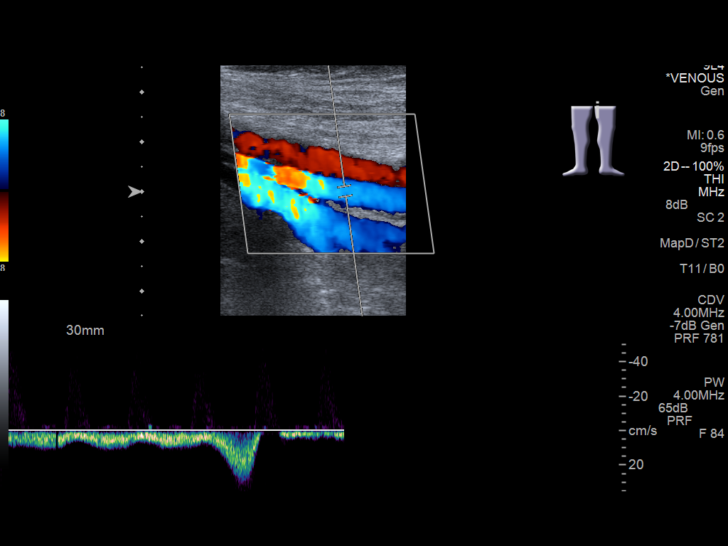
[im 25/44]
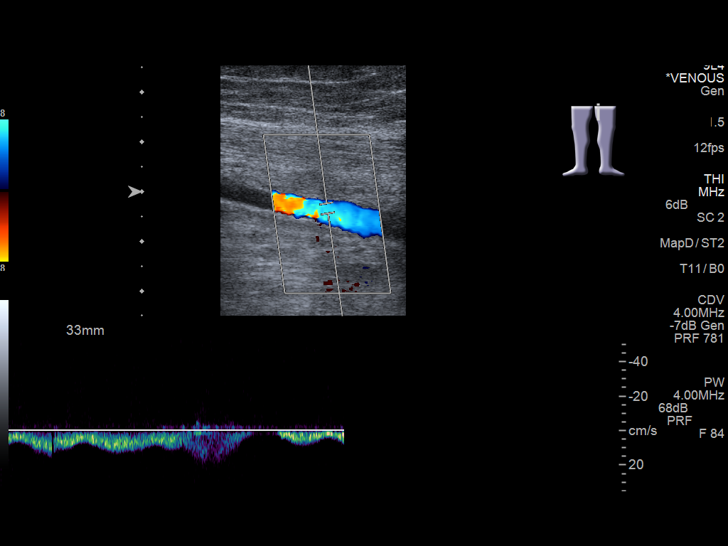
[im 29/44]
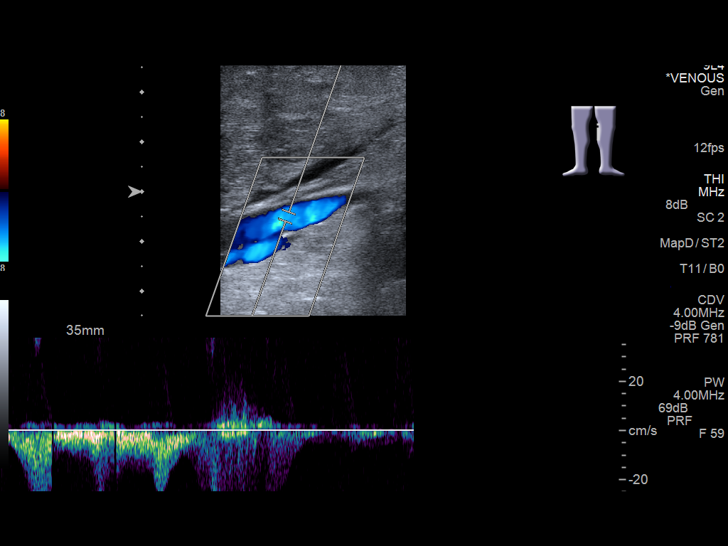
[im 32/44]
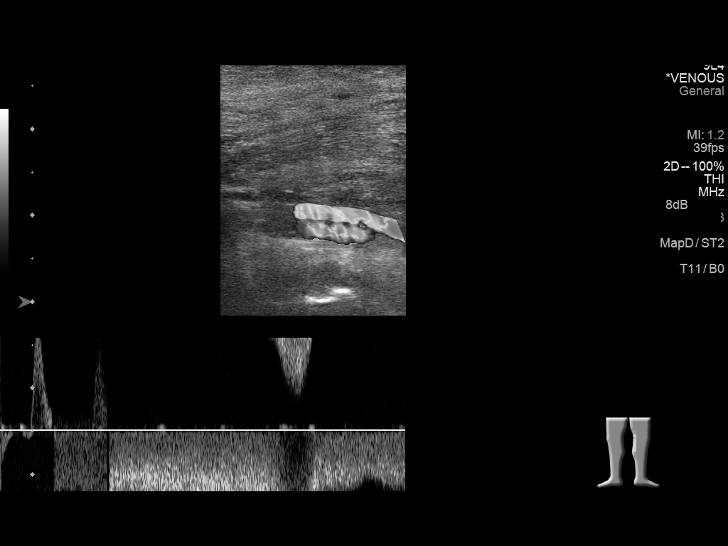
[im 36/44]
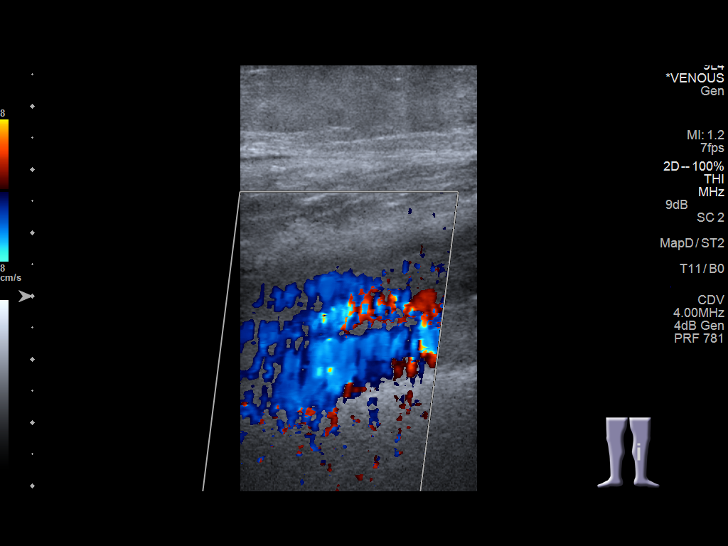
[im 40/44]
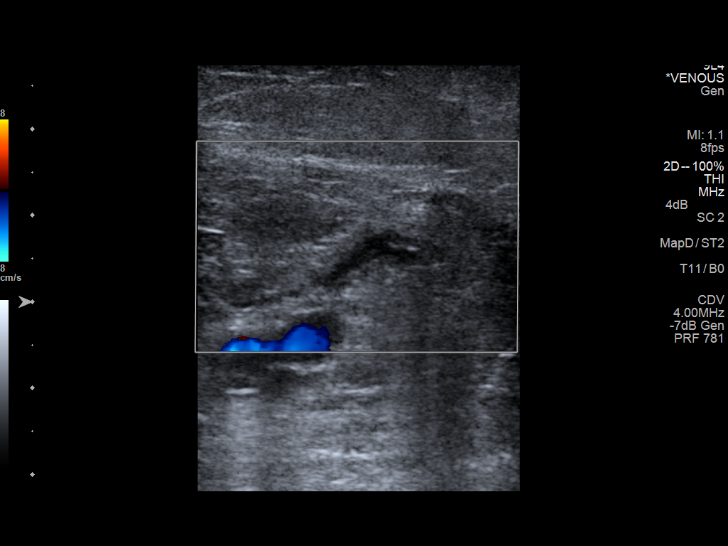
[im 44/44]
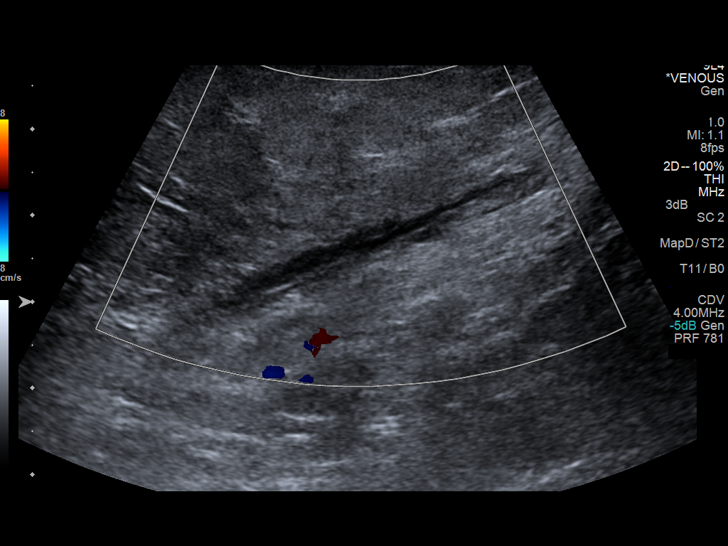

[13 of 24 positions shown; findings below may reference images not displayed]

FINDINGS: Contralateral Common Femoral Vein: Respiratory phasicity is normal
and symmetric with the symptomatic side. No evidence of thrombus.
Normal compressibility.

Common Femoral Vein: No evidence of thrombus. Normal
compressibility, respiratory phasicity and response to augmentation.

Saphenofemoral Junction: No evidence of thrombus. Normal
compressibility and flow on color Doppler imaging.

Profunda Femoral Vein: No evidence of thrombus. Normal
compressibility and flow on color Doppler imaging.

Femoral Vein: No evidence of thrombus. Normal compressibility,
respiratory phasicity and response to augmentation.

Popliteal Vein: No evidence of thrombus. Normal compressibility,
respiratory phasicity and response to augmentation.

Calf Veins: No evidence of thrombus. Normal compressibility and flow
on color Doppler imaging.

Superficial Great Saphenous Vein: No evidence of thrombus. Normal
compressibility and flow on color Doppler imaging.

Venous Reflux:  None.

Other Findings:  Probable Baker cyst within the popliteal fossa.
IMPRESSION: No evidence for left lower extremity DVT.

## 2019-01-03 LAB — CULTURE, URINE: FINAL REPORT: >100000

## 2019-04-20 LAB — CBC WITH AUTO DIFFERENTIAL
Absolute Baso #: 0 10*3/uL (ref 0.0–0.2)
Absolute Eos #: 0.2 10*3/uL (ref 0.0–0.5)
Absolute Lymph #: 1.1 10*3/uL (ref 1.0–3.2)
Absolute Mono #: 0.9 10*3/uL (ref 0.3–1.0)
Basophils %: 0.4 % (ref 0.0–2.0)
Eosinophils %: 3 % (ref 0.0–7.0)
Hematocrit: 44.1 % (ref 34.0–47.0)
Hemoglobin: 14.2 g/dL (ref 11.5–15.7)
Immature Grans (Abs): 0 10*3/uL
Immature Granulocytes: 0.4 %
Lymphocytes: 19.3 % (ref 15.0–45.0)
MCH: 33.1 pg (ref 27.0–34.5)
MCHC: 32.2 g/dL (ref 32.0–36.0)
MCV: 102.8 fL — ABNORMAL HIGH (ref 81.0–99.0)
MPV: 10.1 fL (ref 7.2–13.2)
Monocytes: 15.6 % — ABNORMAL HIGH (ref 4.0–12.0)
NRBC Absolute: 0 10*3/uL
NRBC Automated: 0 %
Neutrophils %: 61.3 % (ref 42.0–74.0)
Neutrophils Absolute: 3.5 10*3/uL (ref 1.6–7.3)
Platelets: 142 10*3/uL (ref 140–440)
RBC: 4.29 x10e6/mcL (ref 3.60–5.20)
RDW: 13.6 % (ref 11.0–16.0)
WBC: 5.7 10*3/uL (ref 3.8–10.6)

## 2019-04-20 LAB — TROPONIN T: Troponin T: <0.01 ng/mL (ref 0.000–0.010)

## 2019-04-20 LAB — BASIC METABOLIC PANEL
Anion Gap: 13 mmol/L (ref 2–17)
BUN: 14 mg/dL (ref 8–23)
CO2: 24 mmol/L (ref 22–29)
Calcium: 8.9 mg/dL (ref 8.8–10.2)
Chloride: 101 mmol/L (ref 98–107)
Creatinine: 0.6 mg/dL (ref 0.5–0.9)
GFR African American: 109 mL/min/{1.73_m2} (ref >=90)
GFR Non-African American: 94 mL/min/{1.73_m2} (ref >=90)
Glucose: 114 mg/dL — ABNORMAL HIGH (ref 70–99)
OSMOLALITY CALCULATED: 277 mOsm/kg (ref 270–287)
Potassium: 3.9 mmol/L (ref 3.5–5.3)
Sodium: 138 mmol/L (ref 135–145)

## 2019-04-20 LAB — MAGNESIUM: Magnesium: 1.9 mg/dL (ref 1.6–2.6)

## 2019-04-20 NOTE — Discharge Summary (Signed)
 ED Clinical Summary                        [facility]  [address]  [place], [place] [address]  [phone]           PERSON INFORMATION  Name: Cindy Buck, Cindy Buck Age:  68 Years DOB: 15-Apr-1951   Sex: Female Language: English PCP: ALVAN BRASIL   Marital Status: Married Phone: [phone] Med Service: MED-Medicine   MRN: [id number] Acct# [id number] Arrival: 04/20/2019 12:22:00   Visit Reason: Increased heart rate; SOB - Shortness of breath; SOB Acuity: 3 LOS: 000 01:44   Address:    [address] [place] [address]   Diagnosis:    Anticoagulated; Atrial fibrillation with RVR; Dyspnea  Medications:          Medications That Were Updated - Follow Current Instructions  Printed Prescriptions  Current metoprolol (metoprolol succinate 25 mg oral tablet, extended release) 1 Tabs Oral (given by mouth) every day. Refills: 0.  Last Dose:____________________    Other Medications  Current metoprolol (Metoprolol Succinate ER 25 mg oral tablet, extended release) 0.5 Tabs Oral (given by mouth) once a day (in the morning).  Last Dose:____________________    Medications that have not changed  Other Medications  acetaminophen (Tylenol 8 HR Arthritis Pain 650 mg oral tablet, extended release) 2 Tabs Oral (given by mouth) as needed., MAX DAILY DOSE OF ACETAMINOPHEN = 3000 MG  Last Dose:____________________  acetaminophen-oxyCODONE (oxyCODONE-acetaminophen 5 mg-325 mg oral tablet range dose) 1 Tabs Oral (given by mouth) as needed for moderate pain., MAX DAILY DOSE OF ACETAMINOPHEN = 3000 MG  Last Dose:____________________  apixaban (Eliquis 5 mg oral tablet) 1 Tabs Oral (given by mouth) 2 times a day.  Last Dose:____________________  azilsartan (Edarbi 40 mg oral tablet) 1 Tabs Oral (given by mouth) once a day (in the evening).  Last Dose:____________________  cetirizine (ZyrTEC 10 mg oral tablet) 1 Tabs Oral (given by mouth) every day.  Last Dose:____________________  furosemide (furosemide 20 mg oral  tablet) 1 Tabs Oral (given by mouth) as needed.  Last Dose:____________________  LORazepam (LORazepam 1 mg oral tablet) 1 Tabs Oral (given by mouth) every day as needed for anxiety.  Last Dose:____________________  omega-3 polyunsaturated fatty acids (Omega Essentials) 2 Capsules Oral (given by mouth) 2 times a day.  Last Dose:____________________  potassium chloride (potassium chloride 10 mEq oral tablet, extended release) 1 Tabs Oral (given by mouth) as needed.  Last Dose:____________________  rosuvastatin (rosuvastatin 5 mg oral tablet) 1 Tabs Oral (given by mouth) Monday/Wednesday/Friday.  Last Dose:____________________  zolpidem (zolpidem 5 mg oral tablet) 1 Tabs Oral (given by mouth) Once a Day (at bedtime) as needed for insomnia.,  THIS MEDICATION IS ASSOCIATED  WITH  AN INCREASED RISK OF FALLS.  Last Dose:____________________      Medications Administered During Visit:                Medication Dose Route   metoprolol 12.5 mg Oral               Allergies      penicillins      sulfa drugs      Major Tests and Procedures:  The following procedures and tests were performed during your ED visit.  COMMON PROCEDURES%>  COMMON PROCEDURES COMMENTS%>                PROVIDER INFORMATION  Provider Role Assigned Sampson Grumet, RN, Nidia HERO ED Nurse 04/20/2019 12:23:46    STACIE [facility] ED MidLevel 04/20/2019 12:24:54        Attending Physician:  STACIE SINCLAIR K      Admit Doc  RAMSEY-PAC,  [facility]     Consulting Doc       VITALS INFORMATION  Vital Sign Triage Latest   Temp Oral ORAL_1%> ORAL%>   Temp Temporal TEMPORAL_1%> TEMPORAL%>   Temp Intravascular INTRAVASCULAR_1%> INTRAVASCULAR%>   Temp Axillary AXILLARY_1%> AXILLARY%>   Temp Rectal RECTAL_1%> RECTAL%>   02 Sat 97 % 99 %   Respiratory Rate RATE_1%> RATE%>   Peripheral Pulse Rate PULSE RATE_1%>126 bpm PULSE RATE%>   Apical Heart Rate HEART RATE_1%> HEART RATE%>   Blood Pressure BLOOD PRESSURE_1%>/ BLOOD PRESSURE_1%>106 mmHg BLOOD  PRESSURE%> / BLOOD PRESSURE%>81 mmHg                 Immunizations      No Immunizations Documented This Visit          DISCHARGE INFORMATION   Discharge Disposition: H Outpt-Sent Home   Discharge Location:  Home   Discharge Date and Time:  04/20/2019 14:06:00   ED Checkout Date and Time:  04/20/2019 14:06:00     DEPART REASON INCOMPLETE INFORMATION               Depart Action Incomplete Reason   Interactive View/I&O Recently assessed               Problems      Active           Afib          PE (pulmonary thromboembolism)              Smoking Status      Never smoker         PATIENT EDUCATION INFORMATION  Instructions:     Atrial Fibrillation, Easy-to-Read     Follow up:                   With: Address: When:   LANI JUNIPER, Physician - Cardiologist [address] [facility] [place], [place] [address]  [phone] Business (1) Within 1 week   Comments:   Return to ED if symptoms worsen. Take previously prescribed Eliquis. We will increase your metoprolol to 25 mg once daily. Start seeing cardiology in [place].              ED PROVIDER DOCUMENTATION

## 2019-04-20 NOTE — ED Notes (Signed)
ED Patient Education Note     Patient Education Materials Follows:  Cardiovascular     Atrial Fibrillation    Atrial fibrillation is a type of heartbeat that is irregular or fast (rapid). If you have this condition, your heart keeps quivering in a weird (chaotic) way. This condition can make it so your heart cannot pump blood normally. Having this condition gives a person more risk for stroke, heart failure, and other heart problems. There are different types of atrial fibrillation. Talk with your doctor to learn about the type that you have.      HOME CARE     Take over-the-counter and prescription medicines only as told by your doctor.     If your doctor prescribed a blood-thinning medicine, take it exactly as told. Taking too much of it can cause bleeding. If you do not take enough of it, you will not have the protection that you need against stroke and other problems.     Do not use any tobacco products. These include cigarettes, chewing tobacco, and e-cigarettes. If you need help quitting, ask your doctor.     If you have apnea (obstructive sleep apnea), manage it as told by your doctor.     Do not drink alcohol.     Do not drink beverages that have caffeine. These include coffee, soda, and tea.     Maintain a healthy weight. Do not use diet pills unless your doctor says they are safe for you. Diet pills may make heart problems worse.     Follow diet instructions as told by your doctor.     Exercise regularly as told by your doctor.     Keep all follow-up visits as told by your doctor. This is important.    GET HELP IF:     You notice a change in the speed, rhythm, or strength of your heartbeat.     You are taking a blood-thinning medicine and you notice more bruising.     You get tired more easily when you move or exercise.    GET HELP RIGHT AWAY IF:     You have pain in your chest or your belly (abdomen).     You have sweating or weakness.     You feel sick to your stomach (nauseous).     You notice blood in  your throw up (vomit), poop (stool), or pee (urine).     You are short of breath.     You suddenly have swollen feet and ankles.     You feel dizzy.     Your suddenly get weak or numb in your face, arms, or legs, especially if it happens on one side of your body.     You have trouble talking, trouble understanding, or both.     Your face or your eyelid droops on one side.    These symptoms may be an emergency. Do not wait to see if the symptoms will go away. Get medical help right away. Call your local emergency services (911 in the [place] Do not drive yourself to the hospital.    This information is not intended to replace advice given to you by your health care provider. Make sure you discuss any questions you have with your health care provider.    Document Released: 08/02/2008 Document Revised: 07/15/2015 Document Reviewed: 02/18/2015  Elsevier Interactive Patient Education ?2016 [employer]

## 2019-04-20 NOTE — ED Notes (Signed)
 ED Triage Note       ED Secondary Triage Entered On:  04/20/2019 12:31 EDT    Performed On:  04/20/2019 12:31 EDT by Erlinda, RN, Nidia HERO               General Information   Barriers to Learning :   None evident   ED Home Meds Section :   Document assessment   [facility] ED Fall Risk Section :   Document assessment   ED Advance Directives Section :   Document assessment   Erlinda RN, Nidia HERO - 04/20/2019 12:31 EDT   (As Of: 04/20/2019 12:31:42 EDT)   Problems(Active)    Acute lower UTI (urinary tract infection) (SNOMED CT  :[id number] )  Name of Problem:   Acute lower UTI (urinary tract infection) ; Recorder:   LUTZ, RN, CARLA W; Confirmation:   Confirmed ; Classification:   Patient Stated ; Code:   [id number] ; Contributor System:   PowerChart ; Last Updated:   03/04/2016 15:12 EDT ; Life Cycle Date:   03/04/2016 ; Life Cycle Status:   Active ; Vocabulary:   SNOMED CT        Afib (IMO  [phone] )  Name of Problem:   Afib ; Recorder:   TAYLOR, RN, JENNIFER S; Confirmation:   Confirmed ; Classification:   Medical ; Code:   [phone] ; Contributor System:   PowerChart ; Last Updated:   02/27/2016 11:48 EDT ; Life Cycle Date:   02/27/2016 ; Life Cycle Status:   Active ; Vocabulary:   IMO        Anxiety (SNOMED CT  :[id number] )  Name of Problem:   Anxiety ; Recorder:   LUTZ, RN, CARLA W; Confirmation:   Confirmed ; Classification:   Patient Stated ; Code:   [phone] ; Contributor System:   PowerChart ; Last Updated:   03/04/2016 15:13 EDT ; Life Cycle Date:   03/04/2016 ; Life Cycle Status:   Active ; Vocabulary:   SNOMED CT        Apnea, sleep (SNOMED CT  :[id number] )  Name of Problem:   Apnea, sleep ; Recorder:   LUTZ, RN, CARLA W; Confirmation:   Confirmed ; Classification:   Patient Stated ; Code:   [id number] ; Contributor System:   PowerChart ; Last Updated:   03/04/2016 15:11 EDT ; Life Cycle Date:   03/04/2016 ; Life  Cycle Status:   Active ; Vocabulary:   SNOMED CT        Arthritis (SNOMED CT  :2721985 )  Name of Problem:   Arthritis ; Recorder:   LUTZ, RN, CARLA W; Confirmation:   Confirmed ; Classification:   Patient Stated ; Code:   [id number] ; Contributor System:   PowerChart ; Last Updated:   03/04/2016 15:12 EDT ; Life Cycle Date:   03/04/2016 ; Life Cycle Status:   Active ; Vocabulary:   SNOMED CT        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 )  Name of Problem:   Cold sores ; Recorder:   LUTZ, RN, CARLA W; Confirmation:   Confirmed ; Classification:   Patient Stated ; Code:   [phone] ; Contributor System:   PowerChart ; Last Updated:   03/04/2016 15:13 EDT ; Life Cycle Date:   03/04/2016 ; Life Cycle Status:   Active ; Vocabulary:   SNOMED CT        Hypertension (SNOMED CT  :[id number] )  Name of Problem:  Hypertension ; Recorder:   LUTZ, RN, CARLA W; Confirmation:   Confirmed ; Classification:   Patient Stated ; Code:   [phone] ; Contributor System:   PowerChart ; Last Updated:   03/04/2016 15:11 EDT ; Life Cycle Date:   03/04/2016 ; Life Cycle Status:   Active ; Vocabulary:   SNOMED CT        Low back pain (SNOMED CT  :[id number] )  Name of Problem:   Low back pain ; Recorder:   HUNTER, RN, DEBORAH L; Confirmation:   Confirmed ; Classification:   Patient Stated ; Code:   [id number] ; Contributor System:   PowerChart ; Last Updated:   03/28/2016 8:49 EDT ; Life Cycle Date:   03/28/2016 ; Life Cycle Status:   Active ; Vocabulary:   SNOMED CT        Lumbar compression fracture (SNOMED CT  :[phone] )  Name of Problem:   Lumbar compression fracture ; Recorder:   LUTZ, RN, CARLA W; Confirmation:   Confirmed ; Classification:   Patient Stated ; Code:   [phone] ; Contributor System:   PowerChart ; Last Updated:   03/04/2016 15:10 EDT ; Life Cycle Date:   03/04/2016 ; Life Cycle Status:   Active ; Vocabulary:   SNOMED CT        Lumbar radicular pain (SNOMED CT  :79822989 )  Name of  Problem:   Lumbar radicular pain ; Recorder:   JAQUELINE DEBBY RADDLE; Confirmation:   Confirmed ; Classification:   Patient Stated ; Code:   [id number] ; Contributor System:   PowerChart ; Last Updated:   03/28/2016 9:17 EDT ; Life Cycle Date:   03/28/2016 ; Life Cycle Status:   Active ; Responsible Provider:   JAQUELINE DEBBY RADDLE; Vocabulary:   SNOMED CT        PE (pulmonary thromboembolism) (IMO  :[phone] )  Name of Problem:   PE (pulmonary thromboembolism) ; Recorder:   WADDELL, RN, JENNIFER S; Confirmation:   Confirmed ; Classification:   Medical ; Code:   [phone] ; Contributor System:   PowerChart ; Last Updated:   02/27/2016 11:47 EDT ; Life Cycle Date:   02/27/2016 ; Life Cycle Status:   Active ; Vocabulary:   IMO          Diagnoses(Active)    Increased heart rate  Date:   04/20/2019 ; Diagnosis Type:   Reason For Visit ; Confirmation:   Complaint of ; Clinical Dx:   Increased heart rate ; Classification:   Medical ; Clinical Service:   Emergency medicine ; Code:   PNED ; Probability:   0 ; Diagnosis Code:   [id number]      SOB - Shortness of breath  Date:   04/20/2019 ; Diagnosis Type:   Reason For Visit ; Confirmation:   Complaint of ; Clinical Dx:   SOB - Shortness of breath ; Classification:   Medical ; Clinical Service:   Emergency medicine ; Code:   PNED ; Probability:   0 ; Diagnosis Code:   [id number]             -    Procedure History   (As Of: 04/20/2019 12:31:42 EDT)     Anesthesia Minutes:   0 ; Procedure Name:   Hx of hysterectomy ; Procedure Minutes:   0            Anesthesia Minutes:   0 ; Procedure Name:   Breast reduction ; Procedure Minutes:  0            Anesthesia Minutes:   0 ; Procedure Name:   History of tonsillectomy ; Procedure Minutes:   0            Anesthesia Minutes:   0 ; Procedure Name:   Knee replacement ; Procedure Minutes:   0 ; Comments:     03/07/2016 10:07 EDT - CAROLENE, RN, PATRICIA E  bilateral            Procedure Dt/Tm:    03/07/2016 12:30:00 EDT ; Location:   [facility] ; Provider:   ALVAN BRASIL; Anesthesia Type:   General ; :   FERLA-MD,  BRIAN P; Anesthesia Minutes:   0 ; Procedure Name:   Kyphoplasty ; Procedure Minutes:   27 ; Comments:     03/07/2016 13:04 EDT - Glendora, RN, Eric  auto-populated from documented surgical case ; Clinical Service:   Surgery            Procedure Dt/Tm:   03/28/2016 09:23:00 EDT ; Location:   [facility] ; Provider:   JAQUELINE DEBBY RADDLE; Anesthesia Type:   Local ; Anesthesia Minutes:   5 ; Procedure Name:   Lumbar Epidural Steroid Injection ; Procedure Minutes:   5 ; Comments:     03/28/2016 9:28 EDT - GARA, RN, ANN D  auto-populated from documented surgical case ; Clinical Service:   Surgery            Procedure Dt/Tm:   04/18/2016 11:45:00 EDT ; Location:   [facility] ; Provider:   JAQUELINE DEBBY RADDLE; Anesthesia Type:   Local ; Anesthesia Minutes:   3 ; Procedure Name:   Lumbar Epidural Steroid Injection ; Procedure Minutes:   3 ; Comments:     04/18/2016 11:56 EDT - MCVICKER, RN, VERONICA  auto-populated from documented surgical case ; Clinical Service:   Surgery            Anesthesia Minutes:   0 ; Procedure Name:   Cholecystectomy ; Procedure Minutes:   0            [facility] Fall Risk Assessment Tool   Hx of falling last 3 months ED Fall :   No   Patient confused or disoriented ED Fall :   No   Patient intoxicated or sedated ED Fall :   No   Patient impaired gait ED Fall :   No   Use a mobility assistance device ED Fall :   No   Patient altered elimination ED Fall :   No   [facility] ED Fall Score :   0    Erlinda RNNidia HERO - 04/20/2019 12:31 EDT   ED Advance Directive   Advance Directive :   No   Kimsel, RN, Nidia HERO - 04/20/2019 12:31 EDT   Med Hx   Medication List   (As Of: 04/20/2019 12:31:42 EDT)   Home Meds    acetaminophen  :   acetaminophen ; Status:   Documented ; Ordered As Mnemonic:   Tylenol 8 HR Arthritis Pain 650 mg oral tablet, extended release ; Simple  Display Line:   1,300 mg, 2 tabs, Oral, PRN, 0 Refill(s) ; Catalog Code:   acetaminophen ; Order Dt/Tm:   03/28/2016 08:46:39 EDT ; Comment:   MAX DAILY DOSE OF ACETAMINOPHEN = 3000 MG          cetirizine  :   cetirizine ;  Status:   Documented ; Ordered As Mnemonic:   ZyrTEC 10 mg oral tablet ; Simple Display Line:   10 mg, 1 tabs, Oral, Daily, 0 Refill(s) ; Catalog Code:   cetirizine ; Order Dt/Tm:   03/28/2016 08:49:01 EDT          omega-3 polyunsaturated fatty acids  :   omega-3 polyunsaturated fatty acids ; Status:   Documented ; Ordered As Mnemonic:   Omega Essentials ; Simple Display Line:   2 caps, Oral, BID, 0 Refill(s) ; Catalog Code:   omega-3 polyunsaturated fatty acids ; Order Dt/Tm:   03/28/2016 08:47:53 EDT          acetaminophen-oxyCODONE  :   acetaminophen-oxyCODONE ; Status:   Documented ; Ordered As Mnemonic:   oxyCODONE-acetaminophen 5 mg-325 mg oral tablet range dose ; Simple Display Line:   1 tabs, Oral, PRN: moderate pain (4-7), 0 Refill(s) ; Ordering Provider:   ALVAN BRASIL; Catalog Code:   acetaminophen-oxyCODONE ; Order Dt/Tm:   03/04/2016 15:09:16 EDT ; Comment:   MAX DAILY DOSE OF ACETAMINOPHEN = 3000 MG          potassium chloride  :   potassium chloride ; Status:   Documented ; Ordered As Mnemonic:   potassium chloride 10 mEq oral tablet, extended release ; Simple Display Line:   10 mEq, 1 tabs, Oral, PRN, 180 tabs, 0 Refill(s) ; Catalog Code:   potassium chloride ; Order Dt/Tm:   02/27/2016 11:54:08 EDT          furosemide  :   furosemide ; Status:   Documented ; Ordered As Mnemonic:   furosemide 20 mg oral tablet ; Simple Display Line:   20 mg, 1 tabs, Oral, PRN, 0 Refill(s) ; Catalog Code:   furosemide ; Order Dt/Tm:   02/27/2016 11:54:08 EDT          LORazepam  :   LORazepam ; Status:   Documented ; Ordered As Mnemonic:   LORazepam 1 mg oral tablet ; Simple Display Line:   1 mg, 1 tabs, Oral, Daily, PRN: anxiety, 0 Refill(s) ; Catalog Code:   LORazepam ; Order Dt/Tm:    02/27/2016 11:54:08 EDT          rosuvastatin  :   rosuvastatin ; Status:   Documented ; Ordered As Mnemonic:   rosuvastatin 5 mg oral tablet ; Simple Display Line:   5 mg, 1 tabs, Oral, Mo/We/Fr, 0 Refill(s) ; Catalog Code:   rosuvastatin ; Order Dt/Tm:   02/27/2016 11:54:08 EDT          apixaban  :   apixaban ; Status:   Documented ; Ordered As Mnemonic:   Eliquis 5 mg oral tablet ; Simple Display Line:   5 mg, 1 tabs, Oral, BID, 60 tabs, 0 Refill(s) ; Catalog Code:   apixaban ; Order Dt/Tm:   02/27/2016 11:54:08 EDT          azilsartan  :   azilsartan ; Status:   Documented ; Ordered As Mnemonic:   Edarbi 40 mg oral tablet ; Simple Display Line:   40 mg, 1 tabs, Oral, qPM, 0 Refill(s) ; Catalog Code:   azilsartan ; Order Dt/Tm:   02/27/2016 11:54:08 EDT          zolpidem  :   zolpidem ; Status:   Documented ; Ordered As Mnemonic:   zolpidem 5 mg oral tablet ; Simple Display Line:   5 mg, 1 tabs, Oral, Once a Day (  at bedtime), PRN: insomnia, 0 Refill(s) ; Catalog Code:   zolpidem ; Order Dt/Tm:   02/27/2016 11:54:08 EDT ; Comment:    THIS MEDICATION IS ASSOCIATED   WITH   AN INCREASED RISK OF FALLS.          metoprolol  :   metoprolol ; Status:   Documented ; Ordered As Mnemonic:   Metoprolol Succinate ER 25 mg oral tablet, extended release ; Simple Display Line:   12.5 mg, 0.5 tabs, Oral, qAM, 30 tabs, 0 Refill(s) ; Catalog Code:   metoprolol ; Order Dt/Tm:   02/27/2016 11:54:08 EDT

## 2019-04-20 NOTE — ED Provider Notes (Signed)
SOB - Shortness of breath, Increased heart rate        Patient:   Cindy Buck, Cindy Buck             MRN: [id number]            FIN: [id number]               Age:   68 years     Sex:  Female     DOB:  Dec 27, 1950   Associated Diagnoses:   Atrial fibrillation with RVR; Anticoagulated; Dyspnea   Author:   Fleet Contras K      Basic Information   Time seen: Provider Seen (ST)   ED Provider/Time:    RAMSEY-PAC,  MARIAH K / 04/20/2019 12:24  .   Additional information: Chief Complaint from Nursing Triage Note   Chief Complaint  Chief Complaint: pt w a hx of a.fib reports SOB for past 3 weeks on and off. states it has gotten worse. (04/20/19 12:27:00).      History of Present Illness   68 year old female with history of A. fib on Eliquis and metoprolol presented to the ER with shortness of breath.  Patient states for the past couple weeks she has had shortness of breath off and on.  States that it certainly none x9 weeks exertional.  States she is taking her previously prescribed medications which includes metoprolol 12-1/2 once daily.  She has a house [place] and is only visiting for 6 weeks at a time.  Her cardiologist is in [place].  States she will be splitting her time equally between the 2 places.  States she had some increased lower extremity swelling over the past few days that she took Lasix for 2 days prior 20 mg.  No known CHF.  Occasional fluttering sensation when she lays on her left side.  Denies fever, cough, chest pain, abdominal pain, nausea, vomiting, COVID contacts.-.        Review of Systems   Constitutional symptoms:  No fever, no chills, no fatigue.    Skin symptoms:  No jaundice, no rash.    Eye symptoms:  No pain, no discharge.    ENMT symptoms:  No sore throat, no nasal congestion.    Respiratory symptoms:  Shortness of breath, No cough,    Cardiovascular symptoms:  Palpitations, no chest pain, no peripheral edema.    Gastrointestinal symptoms:  No abdominal pain, no nausea, no vomiting, no diarrhea.     Genitourinary symptoms:  No dysuria, no vaginal discharge.    Musculoskeletal symptoms:  No back pain,    Neurologic symptoms:  No headache, no dizziness.              Additional review of systems information: All systems reviewed as documented in chart.      Health Status   Allergies:    Allergic Reactions (Selected)  Unknown  Penicillins- No reactions were documented.  Sulfa drugs- No reactions were documented..   Medications:  (Selected)   Documented Medications  Documented  Edarbi 40 mg oral tablet: 40 mg, 1 tabs, Oral, qPM, 0 Refill(s)  Eliquis 5 mg oral tablet: 5 mg, 1 tabs, Oral, BID, 60 tabs, 0 Refill(s)  LORazepam 1 mg oral tablet: 1 mg, 1 tabs, Oral, Daily, PRN: anxiety, 0 Refill(s)  Metoprolol Succinate ER 25 mg oral tablet, extended release: 12.5 mg, 0.5 tabs, Oral, qAM, 30 tabs, 0 Refill(s)  Omega Essentials: 2 caps, Oral, BID, 0 Refill(s)  Tylenol 8 HR Arthritis Pain 650  mg oral tablet, extended release: 1,300 mg, 2 tabs, Oral, PRN, 0 Refill(s)  ZyrTEC 10 mg oral tablet: 10 mg, 1 tabs, Oral, Daily, 0 Refill(s)  furosemide 20 mg oral tablet: 20 mg, 1 tabs, Oral, PRN, 0 Refill(s)  oxyCODONE-acetaminophen 5 mg-325 mg oral tablet range dose: 1 tabs, Oral, PRN: moderate pain (4-7), 0 Refill(s)  potassium chloride 10 mEq oral tablet, extended release: 10 mEq, 1 tabs, Oral, PRN, 180 tabs, 0 Refill(s)  rosuvastatin 5 mg oral tablet: 5 mg, 1 tabs, Oral, Mo/We/Fr, 0 Refill(s)  zolpidem 5 mg oral tablet: 5 mg, 1 tabs, Oral, Once a Day (at bedtime), PRN: insomnia, 0 Refill(s).      Past Medical/ Family/ Social History   Surgical history: Reviewed as documented in chart.   Family history: Reviewed as documented in chart.   Social history: Reviewed as documented in chart.   Problem list:    Active Problems (11)  Acute lower UTI (urinary tract infection)   Afib   Anxiety   Apnea, sleep   Arthritis   Cold sores   Hypertension   Low back pain   Lumbar compression fracture   Lumbar radicular pain   PE (pulmonary  thromboembolism)   , per nurse's notes.      Physical Examination               Vital Signs   Vital Signs   04/20/2019 12:31 EDT Systolic Blood Pressure 158 mmHg  HI    Diastolic Blood Pressure 117 mmHg  >HHI    Peripheral Pulse Rate 126 bpm  HI    Heart Rate Monitored 121 bpm  HI    Respiratory Rate 17 br/min    Mean Arterial Pressure, Cuff 130 mmHg  HI    SpO2 97 %   04/20/2019 12:27 EDT Systolic Blood Pressure 178 mmHg  HI    Diastolic Blood Pressure 106 mmHg  >HHI    Temperature Oral 36.9 degC    Heart Rate Monitored 141 bpm  >HHI    Respiratory Rate 18 br/min    SpO2 97 %    Measurements   04/20/2019 12:29 EDT Body Mass Index est meas 33.76 kg/m2   04/20/2019 12:29 EDT Body Mass Index Measured 33.76 kg/m2   04/20/2019 12:27 EDT Height/Length Measured 165 cm    Weight Dosing 91.9 kg    General:  Alert, no acute distress, Not ill-appearing,    Skin:  Warm, dry.    Head:  Normocephalic.   Neck:  Supple.   Eye:  Normal conjunctiva, vision grossly normal.    Cardiovascular:  No murmur, No edema, Mild tachycardia.  Irregularly irregular, No Regular rate and rhythm,    Respiratory:  Lungs are clear to auscultation, respirations are non-labored.    Chest wall:  No deformity.   Back:  Normal range of motion.   Musculoskeletal:  Normal ROM, normal strength.    Gastrointestinal:  Soft, Nontender, Non distended.    Neurological:  Normal motor observed, normal speech observed.    Psychiatric:  Appropriate mood & affect.      Medical Decision Making   Rationale:  PA/NP reviewed with co-signing physician: ECG, lab results, radiology studies, medication, diagnosis, and plan of care.   Electrocardiogram:  Time 04/20/2019 13:00:00, rate 119, No ST-T changes, normal PR & QRS intervals, A. fib with RVR.  No significant ST changes or T wave inversions.  Interpreted by me..    Results review:  Lab results : Lab View   04/20/2019  13:49 EDT Estimated Creatinine Clearance 101.84 mL/min   04/20/2019 12:41 EDT WBC 5.7 x10e3/mcL    RBC 4.29  x10e6/mcL    Hgb 14.2 g/dL    HCT 96.2 %    MCV 952.8 fL  HI    MCH 33.1 pg    MCHC 32.2 g/dL    RDW 41.3 %    Platelet 142 x10e3/mcL    MPV 10.1 fL    Neutro Auto 61.3 %    Neutro Absolute 3.5 x10e3/mcL    Immature Grans Percent 0.4 %  NA    Immature Grans Absolute 0.0 x10e3/mcL  NA    Lymph Auto 19.3 %    Lymph Absolute 1.1 x10e3/mcL    Mono Auto 15.6 %  HI    Mono Absolute 0.9 x10e3/mcL    Eosinophil Percent 3.0 %    Eos Absolute 0.2 x10e3/mcL    Basophil Auto 0.4 %    Baso Absolute 0.0 x10e3/mcL    NRBC Absolute Auto 0.00 x10e3/mcL  NA    NRBC Percent Auto 0.0 %  NA    Sodium Lvl 138 mmol/L    Potassium Lvl 3.9 mmol/L    Chloride 101 mmol/L    CO2 24 mmol/L    Glucose Random 114 mg/dL  HI    BUN 14 mg/dL    Creatinine Lvl 0.6 mg/dL    AGAP 13 mmol/L    Osmolality Calc 277 mOsm/kg    Calcium Lvl 8.9 mg/dL    eGFR AA 244 WN/UUV/2.53G????    eGFR Non-AA 94 mL/min/1.30m????    Magnesium Lvl 1.9 mg/dL    Trop T Quant <6.440 ng/mL    Radiology results:  XR Chest 1 View Portable     04/20/19 13:06:47  IMPRESSION:  Pulmonary vascular congestion without overt pulmonary edema.  .   Notes:  HDS, NAD, afebrile.  Physical exam as above with Mild tachycardia.  Irregularly irregular.  Lungs CTA.  No peripheral edema. EKG A. fib with RVR.  No significant ST changes or T wave inversions.  Interpreted by me.Marland Kitchen Her heart rate stays steady in the low 100s most of the time.  Chest x-ray normal.  Labs reassuring no leukocytosis.  Troponin negative.  Magnesium within normal limits.  Patient is anticoagulated.  Will give a second 12.5 mg dose of metoprolol.  She feels tired but improved.  Heart rate improved.  Her cardiologist is in [place].  Although she stays half the time in [place].  Will recommend increasing to 25 mg of metoprolol daily and establishing care in [place] with cardiology as well.  Nontoxic safe for discharge..      Impression and Plan   Diagnosis   Atrial fibrillation with RVR (ICD10-CM I48.91, Discharge, Medical)    Anticoagulated (ICD10-CM Z79.01, Discharge, Medical)   Dyspnea (ICD10-CM R06.00, Discharge, Medical)   Plan   Condition: Improved, Stable.    Disposition: Discharged: time  04/20/2019 13:57:00.    Prescriptions: [facility] prescriptions   Pharmacy:  metoprolol succinate 25 mg oral tablet, extended release (Prescribe): 25 mg, 1 tabs, Oral, Daily, 30 tabs, 0 Refill(s).    Patient was given the following educational materials: Atrial Fibrillation, Easy-to-Read.    Follow up with: Zane Herald, Physician - Cardiologist Within 1 week Return to ED if symptoms worsen.  Take previously prescribed Eliquis.  We will increase your metoprolol to 25 mg once daily.  Start seeing cardiology in [place]..    Counseled: I had a detailed discussion with the patient and/or guardian regarding the historical  points/exam findings supporting the discharge diagnosis and need for outpatient followup. Discussed the need to return to the ER if symptoms persist/worsen, or for any questions/concerns that arise at home.    Signature Line     Electronically Signed on 04/20/2019 03:13 PM EDT   ________________________________________________   Fleet Contras K               Modified by: Fleet Contras K on 04/20/2019 01:00 PM EDT      Modified byFleet Contras K on 04/20/2019 01:58 PM EDT      Modified by: Fleet Contras K on 04/20/2019 03:13 PM EDTAddendum by Megan Salon on April 21, 2019 8:43 EDT               I reviewed the patient's chart  Signature Line     Electronically Signed on 04/21/2019 08:43 AM EDT   ________________________________________________   Megan Salon               Modified by: Megan Salon on 04/21/2019 08:43 AM EDT

## 2019-04-20 NOTE — ED Notes (Signed)
ED Triage Note       ED Triage Adult Entered On:  04/20/2019 12:29 EDT    Performed On:  04/20/2019 12:27 EDT by Rusty Aus, RN, [facility]               Triage   Chief Complaint :   pt w a hx of a.fib reports SOB for past 3 weeks on and off. states it has gotten worse.   Numeric Rating Pain Scale :   0 = No pain   [place] Mode of Arrival :   Private vehicle   Infectious Disease Documentation :   Document assessment   Temperature Oral :   36.9 degC(Converted to: 98.4 degF)    Heart Rate Monitored :   141 bpm (>HHI)    Respiratory Rate :   18 br/min   Systolic Blood Pressure :   178 mmHg (HI)    Diastolic Blood Pressure :   106 mmHg (>HHI)    SpO2 :   97 %   Oxygen Therapy :   Room air   Patient presentation :   HR > 100   Chief Complaint or Presentation suggest infection :   No   Dosing Weight Obtained By :   Measured   Weight Dosing :   91.9 kg(Converted to: 202 lb 10 oz)    Height :   165 cm(Converted to: 5 ft 5 in)    Body Mass Index Dosing :   34 kg/m2   Darleen Crocker - 04/20/2019 12:27 EDT   DCP GENERIC CODE   Tracking Acuity :   3   Tracking Group :   ED [employer], RNJamison Neighbor - 04/20/2019 12:27 EDT   ED General Section :   Document assessment   Pregnancy Status :   N/A   ED Allergies Section :   Document assessment   ED Reason for Visit Section :   Document assessment   [place], RN, Jamison Neighbor - 04/20/2019 12:27 EDT   ID Risk Screen Symptoms   Recent Travel History :   No recent travel   Close Contact with COVID-19 ID :   No   Last 14 days COVID-19 ID :   No   TB Symptom Screen :   No symptoms   C. diff Symptom/History ID :   Neither of the above   Darleen Crocker - 04/20/2019 12:27 EDT   Allergies   (As Of: 04/20/2019 12:29:26 EDT)   Allergies (Active)   penicillins  Estimated Onset Date:   Unspecified ; Created By:   Lovena Le, RN, Anderson Malta S; Reaction Status:   Active ; Category:   Drug ; Substance:   penicillins ; Type:   Allergy ; Severity:   Unknown ; Updated By:   Unknown Foley; Reviewed Date:   04/20/2019 12:28 EDT      sulfa drugs  Estimated Onset Date:   Unspecified ; Created By:   Lovena Le, RN, Anderson Malta S; Reaction Status:   Active ; Category:   Drug ; Substance:   sulfa drugs ; Type:   Allergy ; Severity:   Unknown ; Updated By:   Unknown Foley; Reviewed Date:   04/20/2019 12:28 EDT        Psycho-Social   Last 3 mo, thoughts killing self/others :   Patient denies   Darleen Crocker - 04/20/2019 12:27 EDT   ED Reason for Visit   (  As Of: 04/20/2019 12:29:26 EDT)   Problems(Active)    Acute lower UTI (urinary tract infection) (SNOMED CT  :[id number] )  Name of Problem:   Acute lower UTI (urinary tract infection) ; Recorder:   LUTZ, RN, CARLA W; Confirmation:   Confirmed ; Classification:   Patient Stated ; Code:   [id number]:   [id number] ; Contributor System:   PowerChart ; Last Updated:   03/04/2016 15:12 EDT ; Life Cycle Date:   03/04/2016 ; Life Cycle Status:   Active ; Vocabulary:   SNOMED CT        Afib (IMO  :[id number] )  Name of Problem:   Afib ; Recorder:   TAYLOR, RN, JENNIFER S; Confirmation:   Confirmed ; Classification:   Medical ; Code:   [phone] [id number] ; Contributor System:   DietitianowerChart ; Last Updated:   02/27/2016 11:48 EDT ; Life Cycle Date:   02/27/2016 ; Life Cycle Status:   Active ; Vocabulary:   IMO        Anxiety (SNOMED CT  :[phone] )  Name of Problem:   Anxiety ; Recorder:   LUTZ, RN, CARLA W; Confirmation:   Confirmed ; Classification:   Patient Stated ; Code:   [phone] ; Contributor System:   DietitianowerChart ; Last Updated:   03/04/2016 15:13 EDT ; Life Cycle Date:   03/04/2016 ; Life Cycle Status:   Active ; Vocabulary:   SNOMED CT        Apnea, sleep (SNOMED CT  :[id number] )  Name of Problem:   Apnea, sleep ; Recorder:   LUTZ, RN, CARLA W; Confirmation:   Confirmed ; Classification:   Patient Stated ; Code:   [id number]:   [id number] ; Contributor System:   DietitianowerChart ; Last  Updated:   03/04/2016 15:11 EDT ; Life Cycle Date:   03/04/2016 ; Life Cycle Status:   Active ; Vocabulary:   SNOMED CT        Arthritis (SNOMED CT  :[id number] )  Name of Problem:   Arthritis ; Recorder:   LUTZ, RN, CARLA W; Confirmation:   Confirmed ; Classification:   Patient Stated ; Code:   9381017:   [id number] ; Contributor System:   PowerChart ; Last Updated:   03/04/2016 15:12 EDT ; Life Cycle Date:   03/04/2016 ; Life Cycle Status:   Active ; Vocabulary:   SNOMED CT        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 )  Name of Problem:   Cold sores ; Recorder:   LUTZ, RN, CARLA W; Confirmation:   Confirmed ; Classification:   Patient Stated ; Code:   [id number] ; Contributor System:   DietitianowerChart ; Last Updated:   03/04/2016 15:13 EDT ; Life Cycle Date:   03/04/2016 ; Life Cycle Status:   Active ; Vocabulary:   SNOMED CT        Hypertension (SNOMED CT  :35361443159194008132 )  Name of Problem:   Hypertension ; Recorder:   LUTZ, RN, CARLA W; Confirmation:   Confirmed ; Classification:   Patient Stated ; Code:   40086761959194008132 ; Contributor System:   DietitianowerChart ; Last Updated:   03/04/2016 15:11 EDT ; Life Cycle Date:   03/04/2016 ; Life Cycle Status:   Active ; Vocabulary:   SNOMED CT        Low back pain (SNOMED CT  :093267124416139010 )  Name of Problem:   Low back pain ; Recorder:   HUNTER, RN, DEBORAH L; Confirmation:   Confirmed ; Classification:   Patient Stated ; Code:  960454098416139010 ; Contributor System:   PowerChart ; Last Updated:   03/28/2016 8:49 EDT ; Life Cycle Date:   03/28/2016 ; Life Cycle Status:   Active ; Vocabulary:   SNOMED CT        Lumbar compression fracture (SNOMED CT  :[id number] )  Name of Problem:   Lumbar compression fracture ; Recorder:   LUTZ, RN, CARLA W; Confirmation:   Confirmed ; Classification:   Patient Stated ; Code:   [phone]-4D49-8E37-55887DB22C2B ; Contributor System:   DietitianowerChart ; Last Updated:   03/04/2016 15:10 EDT ; Life Cycle Date:   03/04/2016 ; Life Cycle Status:   Active ; Vocabulary:   SNOMED  CT        Lumbar radicular pain (SNOMED CT  :3295188420177010 )  Name of Problem:   Lumbar radicular pain ; Recorder:   Gardiner RhymeWOOTEN-MD,  THOMAS JR; Confirmation:   Confirmed ; Classification:   Patient Stated ; Code:   1660630120177010 ; Contributor System:   PowerChart ; Last Updated:   03/28/2016 9:17 EDT ; Life Cycle Date:   03/28/2016 ; Life Cycle Status:   Active ; Responsible Provider:   Gardiner RhymeWOOTEN-MD,  THOMAS JR; Vocabulary:   SNOMED CT        PE (pulmonary thromboembolism) (IMO  [phone] )  Name of Problem:   PE (pulmonary thromboembolism) ; Recorder:   Ladona RidgelAYLOR, RN, JENNIFER S; Confirmation:   Confirmed ; Classification:   Medical ; Code:   [phone] ; Contributor System:   PowerChart ; Last Updated:   02/27/2016 11:47 EDT ; Life Cycle Date:   02/27/2016 ; Life Cycle Status:   Active ; Vocabulary:   IMO          Diagnoses(Active)    Increased heart rate  Date:   04/20/2019 ; Diagnosis Type:   Reason For Visit ; Confirmation:   Complaint of ; Clinical Dx:   Increased heart rate ; Classification:   Medical ; Clinical Service:   Emergency medicine ; Code:   PNED ; Probability:   0 ; Diagnosis Code:   [id number]      SOB - Shortness of breath  Date:   04/20/2019 ; Diagnosis Type:   Reason For Visit ; Confirmation:   Complaint of ; Clinical Dx:   SOB - Shortness of breath ; Classification:   Medical ; Clinical Service:   Emergency medicine ; Code:   PNED ; Probability:   0 ; Diagnosis Code:   [id number]

## 2019-04-20 NOTE — ED Notes (Signed)
 ED Patient Summary       ;       [facility] Emergency Department  [address] [place], [place] [address]  [phone]  Discharge Instructions (Patient)  _______________________________________     Name: Cindy Buck, Cindy Buck  DOB:  [birth year]                   MRN: [id number]                   FIN: [phone]%[phone]  Reason For Visit: Increased heart rate; SOB - Shortness of breath; SOB  Final Diagnosis: Anticoagulated; Atrial fibrillation with RVR; Dyspnea     Visit Date: 04/20/2019 12:22:00  Address: [address] [place] [address]  Phone: [phone]     Primary Care Provider:      Name: ALVAN BRASIL      Phone: [phone]        Emergency Department Providers:         Primary Physician:            [facility] would like to thank you for allowing us  to assist you with your healthcare needs. The following includes patient education materials and information regarding your injury/illness.     Follow-up Instructions: You were treated today on an emergency basis, it may be wise to contact your primary care provider to notify them of your visit today. You may have been referred to your regular doctor or a specialist, please follow up as instructed. If your condition worsens or you can't get in to see the doctor, contact the Emergency Department.              With: Address: When:   LANI JUNIPER, Physician - Cardiologist [address] [employer] [place], [place] [address]  [phone] Business (1) Within 1 week   Comments:   Return to ED if symptoms worsen. Take previously prescribed Eliquis. We will increase your metoprolol to 25 mg once daily. Start seeing cardiology in [place].              Printed Prescriptions:    Patient Education Materials:  Discharge Orders          Discharge Patient 04/20/19 13:58:00 EDT         Comment:      Atrial Fibrillation, Easy-to-Read     Atrial Fibrillation    Atrial fibrillation is a type of heartbeat that is irregular or fast (rapid). If you have  this condition, your heart keeps quivering in a weird (chaotic) way. This condition can make it so your heart cannot pump blood normally. Having this condition gives a person more risk for stroke, heart failure, and other heart problems. There are different types of atrial fibrillation. Talk with your doctor to learn about the type that you have.      HOME CARE     Take over-the-counter and prescription medicines only as told by your doctor.     If your doctor prescribed a blood-thinning medicine, take it exactly as told. Taking too much of it can cause bleeding. If you do not take enough of it, you will not have the protection that you need against stroke and other problems.     Do not use any tobacco products. These include cigarettes, chewing tobacco, and e-cigarettes. If you need help quitting, ask your doctor.     If you have apnea (obstructive sleep apnea), manage it as told by your doctor.  Do not drink alcohol.     Do not drink beverages that have caffeine. These include coffee, soda, and tea.     Maintain a healthy weight. Do not use diet pills unless your doctor says they are safe for you. Diet pills may make heart problems worse.     Follow diet instructions as told by your doctor.     Exercise regularly as told by your doctor.     Keep all follow-up visits as told by your doctor. This is important.    GET HELP IF:     You notice a change in the speed, rhythm, or strength of your heartbeat.     You are taking a blood-thinning medicine and you notice more bruising.     You get tired more easily when you move or exercise.    GET HELP RIGHT AWAY IF:     You have pain in your chest or your belly (abdomen).     You have sweating or weakness.     You feel sick to your stomach (nauseous).     You notice blood in your throw up (vomit), poop (stool), or pee (urine).     You are short of breath.     You suddenly have swollen feet and ankles.     You feel dizzy.     Your suddenly get weak or numb in your face,  arms, or legs, especially if it happens on one side of your body.     You have trouble talking, trouble understanding, or both.     Your face or your eyelid droops on one side.    These symptoms may be an emergency. Do not wait to see if the symptoms will go away. Get medical help right away. Call your local emergency services (911 in the [place] Do not drive yourself to the hospital.    This information is not intended to replace advice given to you by your health care provider. Make sure you discuss any questions you have with your health care provider.    Document Released: 08/02/2008 Document Revised: 07/15/2015 Document Reviewed: 02/18/2015  Elsevier Interactive Patient Education ?2016 Elsevier Inc.         Allergy Info: sulfa drugs; penicillins     Medication Information:  [facility] ED Physicians provided you with a complete list of medications post discharge, if you have been instructed to stop taking a medication please ensure you also follow up with this information to your Primary Care Physician.  Unless otherwise noted, patient will continue to take medications as prescribed prior to the Emergency Room visit.  Any specific questions regarding your chronic medications and dosages should be discussed with your physician(s) and pharmacist.          acetaminophen (Tylenol 8 HR Arthritis Pain 650 mg oral tablet, extended release) 2 Tabs Oral (given by mouth) as needed., MAX DAILY DOSE OF ACETAMINOPHEN = 3000 MG  acetaminophen-oxyCODONE (oxyCODONE-acetaminophen 5 mg-325 mg oral tablet range dose) 1 Tabs Oral (given by mouth) as needed for moderate pain., MAX DAILY DOSE OF ACETAMINOPHEN = 3000 MG  apixaban (Eliquis 5 mg oral tablet) 1 Tabs Oral (given by mouth) 2 times a day.  azilsartan (Edarbi 40 mg oral tablet) 1 Tabs Oral (given by mouth) once a day (in the evening).  cetirizine (ZyrTEC 10 mg oral tablet) 1 Tabs Oral (given by mouth) every day.  furosemide (furosemide 20 mg oral tablet) 1 Tabs Oral  (given by mouth) as needed.  LORazepam (LORazepam 1 mg oral tablet) 1 Tabs Oral (given by mouth) every day as needed for anxiety.  metoprolol (metoprolol succinate 25 mg oral tablet, extended release) 1 Tabs Oral (given by mouth) every day. Refills: 0.  metoprolol (Metoprolol Succinate ER 25 mg oral tablet, extended release) 0.5 Tabs Oral (given by mouth) once a day (in the morning).  omega-3 polyunsaturated fatty acids (Omega Essentials) 2 Capsules Oral (given by mouth) 2 times a day.  potassium chloride (potassium chloride 10 mEq oral tablet, extended release) 1 Tabs Oral (given by mouth) as needed.  rosuvastatin (rosuvastatin 5 mg oral tablet) 1 Tabs Oral (given by mouth) Monday/Wednesday/Friday.  zolpidem (zolpidem 5 mg oral tablet) 1 Tabs Oral (given by mouth) Once a Day (at bedtime) as needed for insomnia.,  THIS MEDICATION IS ASSOCIATED  WITH  AN INCREASED RISK OF FALLS.      Medications Administered During Visit:              Medication Dose Route   metoprolol 12.5 mg Oral          Major Tests and Procedures:  The following procedures and tests were performed during your ED visit.  COMMON PROCEDURES%>  COMMON PROCEDURES COMMENTS%>          Laboratory Orders  Name Status Details   BMP Completed Blood, Stat, ST - Stat, 04/20/19 12:39:00 EDT, 04/20/19 12:39:00 EDT, Nurse collect, RAMSEY-PAC,  MARIAH K, Print label Y/N   CBCDIFF Completed Blood, Stat, ST - Stat, 04/20/19 12:39:00 EDT, 04/20/19 12:39:00 EDT, Nurse collect, RAMSEY-PAC,  MARIAH K, Print label Y/N   Mg Completed Blood, Stat, ST - Stat, 04/20/19 12:39:00 EDT, 04/20/19 12:39:00 EDT, Nurse collect, RAMSEY-PAC,  MARIAH K, Print label Y/N   Trop T Completed Blood, Stat, ST - Stat, 04/20/19 12:39:00 EDT, 04/20/19 12:39:00 EDT, Nurse collect, RAMSEY-PAC,  MARIAH K, Print label Y/N   Irwin Carbon Completed Blood, Stat, ST - Stat, Collected, 04/20/19 12:41:00 EDT [id number], 04/20/19 12:41:00 EDT, Nurse collect, Venous Draw, 04/20/19 12:50:00 EDT, RH CP Login,  RAMSEY-PAC,  MARIAH K, Print label Y/N, rh_accession_1, 1.8 mL Aolz/*[id number]*/, Complete               Radiology Orders  Name Status Details   XR Chest 1 View Portable Completed 04/20/19 12:40:00 EDT, STAT 1 hour or less, Reason: Shortness of breath  (SOB), Transport Mode: Portable, pp_set_radiology_subspecialty               Patient Care Orders  Name Status Details   Cardiac/NIBP/Pulse Ox Monitoring Completed 04/20/19 12:39:00 EDT, This message can only be seen by Nursing, it is not visible to Pharmacy, Laboratory, or Radiology., 04/20/19 12:39:00 EDT, 04/20/19 12:39:00 EDT, Once   Discharge Patient Ordered 04/20/19 13:58:00 EDT   ED Assessment Adult Completed 04/20/19 12:29:27 EDT, 04/20/19 12:29:27 EDT   ED Only Oxygen Therapy Completed 04/20/19 12:39:00 EDT, STAT 1 hour or less, 04/20/19 12:39:00 EDT, Keep SAT > 92%   ED Secondary Triage Completed 04/20/19 12:29:27 EDT, 04/20/19 12:29:27 EDT   ED Triage Adult Completed 04/20/19 12:22:14 EDT, 04/20/19 12:22:14 EDT   Saline Lock Insert Completed 04/20/19 12:39:00 EDT, Once, 04/20/19 12:39:00 EDT       ---------------------------------------------------------------------------------------------------------------------  [facility] allows you to manage your health, view your test results, and retrieve your discharge documents from your hospital stay securely and conveniently from your computer.     To begin the enrollment process, visit [email or web address] Click on "Sign up now" under [facility].   Comment:

## 2019-05-06 NOTE — Anesthesia Post-Procedure Evaluation (Signed)
Postanesthesia Evaluation        Patient:   Cindy Buck, Cindy Buck             MRN: [id number]            FIN: [id number]               Age:   68 years     Sex:  Female     DOB:  06/06/1951   Associated Diagnoses:   None   Author:   Dawt Reeb-MD,  Kymere Fullington      Postoperative Information   Post Operative Info:       Patient location: PACU.       Assessment   Postanesthesia assessment   Respiratory function: Respiratory rate, airway, and oxygen saturation are at adequate levels.     Cardiovascular function: Heart Rate stable, Blood Pressure stable, Postoperative hydration status Adequate.     Mental status: appropriate for level of anesthesia.     Temperature: within normal limits.     Pain Control: Adequate.     Nausea/Vomiting: Absent.     Signature Line     Electronically Signed on 05/06/2019 01:42 PM EDT   ________________________________________________   Farheen Pfahler-MD,  Crissie Sickles

## 2019-05-06 NOTE — Nursing Note (Signed)
Nursing Discharge Summary - Text       Nursing Discharge Summary Entered On:  05/06/2019 12:02 EDT    Performed On:  05/06/2019 12:15 EDT by Charletta Cousin, RN, [place]   Discharge To :   Home independently   Mode of Discharge :   Wheelchair   Transportation :   Private vehicle   Accompanied By :   Matthias Hughs, RN, Danae Chen R - 05/06/2019 12:36 EDT   Education   Responsible Learner(s) :   Home Caregiver Name/Relationship: Dominica Severin (spouse) [phone]        Performed by: Myrtis Hopping R - 05/06/2019 10:28       Home Caregiver Present for Session :   No     Barriers To Learning :   None evident     Teaching Method :   Explanation, Printed materials   Alanda Slim - 05/06/2019 12:37 EDT     Post-Hospital Education Adult Grid   Activity Expectations :   Rose[place] Sensor understanding     Diagnostic Results :   [place] Sensor understanding     Disease Process :   Verbalizes understanding     Equipment/Devices :   Science writer understanding     Importance of Follow-Up Visits :   Verbalizes understanding     Pain Management :   Verbalizes understanding     Physical Limitations :   Verbalizes understanding     Plan of Care :   Verbalizes understanding     Postoperative Instructions :   Verbalizes understanding     When to Call Health Care Provider :   [employer] understanding   [place], RN, Michel Santee - 05/06/2019 12:37 EDT     Medication Education Adult Grid   Med Dosage, Route, Scheduling :   Verbalizes understanding   (Comment: pt instructed to restart lasix x5 days per Dr. Arsenio Loader, RN, Danae Chen R - 05/06/2019 12:01 EDT] )   Alanda Slim - 05/06/2019 12:37 EDT     Safety Education Adult Grid   Safety, Fall :   [place] understanding   [place], RN, Michel Santee - 05/06/2019 12:37 EDT     Additional Learner(s) Present :   Spouse   (Comment: Pts spouse Dominica Severin) to pick pt up. Written instructions reviewed with pt. Charletta Cousin, RN, Danae Chen R - 05/06/2019 12:01 EDT] )     Time Spent Educating Patient :   15 minutes    Alanda Slim - 05/06/2019 12:37 EDT

## 2019-05-06 NOTE — Discharge Summary (Signed)
 Inpatient Clinical Summary             [facility]  Post-Acute Care Transfer Instructions  PERSON INFORMATION   Name: Cindy Buck, Cindy Buck   MRN: [id number]    FIN#: [id number]   PHYSICIANS  Admitting Physician: BARNETT ARGYLE  Attending Physician: BARNETT ARGYLE   PCP: ALVAN BRASIL  Discharge Diagnosis:   Comment:       PATIENT EDUCATION INFORMATION  Instructions:             Conscious Sedation (Adult) (DENEBA) (CUSTOM); Cardioversion, Discharge Instructions  Medication Leaflets:               Follow-up:                           With: Address: When:   BRASIL ALVAN [address] [place], [place] [address]  [phone] Business (1)        With: Address: When:   BARNETT ARGYLE [address] [place], [place] [address]  [phone] Within 2 to 4 weeks                                    Type Location Start [facility]   Surgery - Endoscopy TEE / Cardioversion [facility] Cath Lab Jun/29/2020 12:30 PM Jun/29/2020 1:00 PM Confirmed          MEDICATION LIST  Medication Reconciliation at Discharge:          Medications That Have Not Changed  Other Medications  apixaban (Eliquis 5 mg oral tablet) 1 Tabs Oral (given by mouth) 2 times a day.  Last Dose:____________________  cetirizine (ZyrTEC 10 mg oral tablet) 1 Tabs Oral (given by mouth) every day.  Last Dose:____________________  dronedarone (Multaq 400 mg oral tablet) 1 Tabs Oral (given by mouth) 2 times a day.  Last Dose:____________________  furosemide (furosemide 20 mg oral tablet) 1 Tabs Oral (given by mouth) every day as needed.  Last Dose:____________________  irbesartan (irbesartan 75 mg oral tablet) 1 Tabs Oral (given by mouth) every day.  Last Dose:____________________  LORazepam (LORazepam 1 mg oral tablet) 1 Tabs Oral (given by mouth) every day as needed for anxiety.  Last Dose:____________________  metoprolol (metoprolol succinate 25 mg oral tablet, extended release) 1 Tabs Oral (given by mouth) every day.  Refills: 0.  Last Dose:____________________  potassium chloride (potassium chloride 10 mEq oral tablet, extended release) 1 Tabs Oral (given by mouth) every day as needed.  Last Dose:____________________  rosuvastatin (rosuvastatin 5 mg oral tablet) 2 Tabs Oral (given by mouth) Monday/Wednesday/Friday.  Last Dose:____________________  valACYclovir (valACYclovir 1 g oral tablet) 1 Tabs Oral (given by mouth) every day as needed other (see comment).  Last Dose:____________________         Patient's Final Home Medication List Upon Discharge:           apixaban (Eliquis 5 mg oral tablet) 1 Tabs Oral (given by mouth) 2 times a day.  cetirizine (ZyrTEC 10 mg oral tablet) 1 Tabs Oral (given by mouth) every day.  dronedarone (Multaq 400 mg oral tablet) 1 Tabs Oral (given by mouth) 2 times a day.  furosemide (furosemide 20 mg oral tablet) 1 Tabs Oral (given by mouth) every day as needed.  irbesartan (irbesartan 75 mg oral tablet) 1 Tabs Oral (given by mouth) every day.  LORazepam (LORazepam 1 mg  oral tablet) 1 Tabs Oral (given by mouth) every day as needed for anxiety.  metoprolol (metoprolol succinate 25 mg oral tablet, extended release) 1 Tabs Oral (given by mouth) every day. Refills: 0.  potassium chloride (potassium chloride 10 mEq oral tablet, extended release) 1 Tabs Oral (given by mouth) every day as needed.  rosuvastatin (rosuvastatin 5 mg oral tablet) 2 Tabs Oral (given by mouth) Monday/Wednesday/Friday.  valACYclovir (valACYclovir 1 g oral tablet) 1 Tabs Oral (given by mouth) every day as needed other (see comment).         Comment:       ORDERS          Order Name Order Details   Discharge Patient 05/06/19 11:07:00 EDT, Discharge Home/Self Care

## 2019-05-06 NOTE — Discharge Summary (Signed)
 Inpatient Patient Summary               [facility]  [address]  [place], [place] [address]  [phone]  Patient Discharge Instructions    Name: Cindy Buck, Cindy Buck  Current Date: 05/06/2019 11:30:09  DOB: [birth year] MRN: [id number] FIN: [id number]  Patient Address: [address] [place] [address]  Patient Phone: [phone]  Primary Care Provider:  Name: ALVAN BRASIL  Phone: [phone]   Immunizations Provided:      Discharge Diagnosis:   Discharged To: TO, ANTICIPATED%>  Home Treatments: TREATMENTS, ANTICIPATED%>  Devices/Equipment: EQUIPMENT REHAB%>  Post Hospital Services: HOSPITAL SERVICES%>  Professional Skilled Services: SKILLED SERVICES%>  Therapist, sports and Community Resources: SERV AND COMM RES, ANTICIPATED%>  Mode of Discharge Transportation: TRANSPORTATION%>  Discharge Orders          Discharge Patient 05/06/19 11:07:00 EDT, Discharge Home/Self Care        Comment:     Medications   During the course of your visit, your medication list was updated with the most current information. The details of those changes are reflected below:          Medications That Have Not Changed  Other Medications  apixaban (Eliquis 5 mg oral tablet) 1 Tabs Oral (given by mouth) 2 times a day.  Last Dose:____________________  cetirizine (ZyrTEC 10 mg oral tablet) 1 Tabs Oral (given by mouth) every day.  Last Dose:____________________  dronedarone (Multaq 400 mg oral tablet) 1 Tabs Oral (given by mouth) 2 times a day.  Last Dose:____________________  furosemide (furosemide 20 mg oral tablet) 1 Tabs Oral (given by mouth) every day as needed.  Last Dose:____________________  irbesartan (irbesartan 75 mg oral tablet) 1 Tabs Oral (given by mouth) every day.  Last Dose:____________________  LORazepam (LORazepam 1 mg oral tablet) 1 Tabs Oral (given by mouth) every day as needed for anxiety.  Last Dose:____________________  metoprolol (metoprolol succinate 25 mg oral tablet, extended release) 1  Tabs Oral (given by mouth) every day. Refills: 0.  Last Dose:____________________  potassium chloride (potassium chloride 10 mEq oral tablet, extended release) 1 Tabs Oral (given by mouth) every day as needed.  Last Dose:____________________  rosuvastatin (rosuvastatin 5 mg oral tablet) 2 Tabs Oral (given by mouth) Monday/Wednesday/Friday.  Last Dose:____________________  valACYclovir (valACYclovir 1 g oral tablet) 1 Tabs Oral (given by mouth) every day as needed other (see comment).  Last Dose:____________________        [facility] would like to thank you for allowing us  to assist you with your healthcare needs. The following includes patient education materials and information regarding your injury/illness.    Kontz, Shylo has been given the following list of follow-up instructions, prescriptions, and patient education materials:  Follow-up Instructions:              With: Address: When:   BRASIL ALVAN [address], [address] [place], [place] [address]  [phone] Business (1)        With: Address: When:   BARNETT ARGYLE [address] [place], [place] [address]  [phone] Within 2 to 4 weeks                       Type Location Start [facility]   Surgery - Endoscopy TEE / Cardioversion [facility] Cath Lab Jun/29/2020 12:30 PM Jun/29/2020 1:00 PM Confirmed             It is important to  always keep an active list of medications available so that you can share with other providers and manage your medications appropriately. As an additional courtesy, we are also providing you with your final active medications list that you can keep with you.           apixaban (Eliquis 5 mg oral tablet) 1 Tabs Oral (given by mouth) 2 times a day.  cetirizine (ZyrTEC 10 mg oral tablet) 1 Tabs Oral (given by mouth) every day.  dronedarone (Multaq 400 mg oral tablet) 1 Tabs Oral (given by mouth) 2 times a day.  furosemide (furosemide 20 mg oral tablet) 1 Tabs Oral (given by mouth) every day as  needed.  irbesartan (irbesartan 75 mg oral tablet) 1 Tabs Oral (given by mouth) every day.  LORazepam (LORazepam 1 mg oral tablet) 1 Tabs Oral (given by mouth) every day as needed for anxiety.  metoprolol (metoprolol succinate 25 mg oral tablet, extended release) 1 Tabs Oral (given by mouth) every day. Refills: 0.  potassium chloride (potassium chloride 10 mEq oral tablet, extended release) 1 Tabs Oral (given by mouth) every day as needed.  rosuvastatin (rosuvastatin 5 mg oral tablet) 2 Tabs Oral (given by mouth) Monday/Wednesday/Friday.  valACYclovir (valACYclovir 1 g oral tablet) 1 Tabs Oral (given by mouth) every day as needed other (see comment).      Take only the medications listed above. Contact your doctor prior to taking any medications not on this list.      Discharge instructions, if any, will display below    Instructions for Diet: INSTRUCTIONS FOR DIET%>   Instructions for Supplements: SUPPLEMENT INSTRUCTIONS%>   Instructions for Activity: INSTRUCTIONS FOR ACTIVITY%>   Instructions for Wound Care: INSTRUCTIONS FOR WOUND CARE%>    Medication leaflets, if any, will display below     Patient education materials, if any, will display below               Conscious Sedation (Adult)  You have been given medicine by vein to make you sleep during your surgery. This may have included both a pain medicine and sleeping medicine. Most of the effects have worn off. But you may still have some drowsiness for the next 6 to 8 hours.  Home care  Follow these guidelines when you get home:   For the next 8 hours, you should be watched by a responsible adult. This person should make sure your condition is not getting worse.   Don't take any medicine by mouth for pain or for sleep during the next 4 hours. These might react with the medicines you were given in the hospital. This could cause a much stronger response than usual.   Don't drink any alcohol for the next 24 hours.   Don't drive, operate dangerous machinery, or  make important business or personal decisions during the next 24 hours.  Follow-up care  Follow up with your healthcare provider if you are not alert and back to your usual level of activity within 12 hours.  When to seek medical advice  Call your healthcare provider right away if any of these occur:   Drowsiness gets worse   Weakness or dizziness gets worse   Repeated vomiting   You cannot be awakened      2000-2017 [employer] [address], [place], [place] [address]. All rights reserved. This information is not intended as a substitute for professional medical care. Always follow your healthcare professional's instructions.  Discharge Instructions for Cardioversion   Your healthcare provider did a procedure called cardioversion. He or she used a controlled electric shock or a medicine to briefly stop all electrical activity in your heart. This helped restore your heart?s normal rhythm. Here are some instructions to follow while you recover.   Home care    Before cardioversion, you will typically be given sedation. So, you won't be able to drive home. You will need a ride. Wait at least 24 hours before driving a car or operating heavy machinery after getting sedating medicines.     The skin on your chest may be irritated or feel like it's sunburned. Your healthcare provider may prescribe a soothing lotion to ease this discomfort.These minor symptoms will go away in a few days.     Ask your healthcare provider about medicines to keep your heart rhythm steady.     If you were prescribed medicine, take it as instructed by your healthcare provider. Don?t skip doses or take double doses. Cardioversion requires blood thinners for at least 4 weeks after the procedure to prevent a delayed risk of stroke when treating atrial fibrillation or atrial flutter. Be sure you discuss which medicine you are taking to prevent stroke. Ask when you need to have your medicine levels checked. Also  ask whether you may be able to stop taking it in the future or whether you need to take it for life. Some of these blood-thinning medicines such as warfarin will have the dose adjusted, and interact with other medicines or foods. Your healthcare team will give you full instructions on what to watch out for. Report bleeding or symptoms of stroke immediately to your healthcare team and seek emergency medical attention.     Learn to take your own pulse. Keep a record of your results. Ask your healthcare provider when you should seek emergency medical attention. He or she will tell you which pulse rate reading is dangerous.     Cardioversion is usually short term (temporary). You may need it repeated if the abnormal heart rhythm returns. After the procedure, your healthcare provider will tell you if the treatment worked or if you will need further treatments or medicine.    Follow-up care   Make a follow-up appointment, or as advised.   When to call your healthcare provider   Call 911right away if you have:     Chest pain     Shortness of breath     Loss of vision, speech, or strength or coordination in any body part    Call your healthcare provider right away if you:     Feel faint,dizzy, or lightheaded     Have chest pain with increased activity     Have irregular heartbeat or fast pulse     Have bleeding issues from blood-thinning medicines        2000-2020 [employer] [place]. [address], [place], [place] [address]. All rights reserved. This information is not intended as a substitute for professional medical care. Always follow your healthcare professional's instructions.           IS IT A STROKE? Act FAST and Check for these signs:    FACE                         Does the face look uneven?    ARM  Does one arm drift down?    SPEECH                    Does their speech sound strange?    TIME                         Call 9-1-1 at any sign of stroke  Heart Attack  Signs  Chest discomfort: Most heart attacks involve discomfort in the center of the chest and lasts more than a few minutes, or goes away and comes back. It can feel like uncomfortable pressure, squeezing, fullness or pain.  Discomfort in upper body: Symptoms can include pain or discomfort in one or both arms, back, neck, jaw or stomach.  Shortness of breath: With or without discomfort.  Other signs: Breaking out in a cold sweat, nausea, or lightheaded.  Remember, MINUTES DO MATTER. If you experience any of these heart attack warning signs, call 9-1-1 to get immediate medical attention!     ---------------------------------------------------------------------------------------------------------------------  [facility] allows you to manage your health, view your test results, and retrieve your discharge documents from your hospital stay securely and conveniently from your computer.  To begin the enrollment process, visit [email or web address] Click on "Sign up now" under [facility].

## 2019-05-06 NOTE — Anesthesia Pre-Procedure Evaluation (Signed)
Preanesthesia Evaluation        Patient:   Cindy Buck, Cindy Buck             MRN: [id number]            FIN: [id number]               Age:   68 years     Sex:  Female     DOB:  [birth year]   Associated Diagnoses:   None   Author:   Aricka Goldberger-MD,  Krisann Mckenna      Preoperative Information   NPO:  NPO greater than 8 hours.    Anesthesia history     Patient's history: negative.     Family's history: negative.        Health Status   Allergies:    Allergic Reactions (Selected)  Unknown  Penicillins- No reactions were documented.  Sulfa drugs- No reactions were documented.   Current medications:    Home Medications (10) Active  Eliquis 5 mg oral tablet 5 mg = 1 tabs, Oral, BID  furosemide 20 mg oral tablet 20 mg = 1 tabs, PRN, Oral, Daily  irbesartan 75 mg oral tablet 75 mg = 1 tabs, Oral, Daily  LORazepam 1 mg oral tablet 1 mg = 1 tabs, PRN, Oral, Daily  metoprolol succinate 25 mg oral tablet, extended release 25 mg = 1 tabs, Oral, Daily  Multaq 400 mg oral tablet 400 mg = 1 tabs, Oral, BID  potassium chloride 10 mEq oral tablet, extended release 10 mEq = 1 tabs, PRN, Oral, Daily  rosuvastatin 5 mg oral tablet 10 mg = 2 tabs, Oral, Mo/We/Fr  valACYclovir 1 g oral tablet 1 g = 1 tabs, PRN, Oral, Daily  ZyrTEC 10 mg oral tablet 10 mg = 1 tabs, Oral, Daily     Problem list:    Active Problems (11)  Acute lower UTI (urinary tract infection)   Afib   Anxiety   Apnea, sleep   Arthritis   Cold sores   Hypertension   Low back pain   Lumbar compression fracture   Lumbar radicular pain   PE (pulmonary thromboembolism)         Histories   Past Medical History:    No active or resolved past medical history items have been selected or recorded.   Procedure history:    Cardioversion ([id number]) on 05/06/2019 at 67 Years.  Lumbar Epidural Steroid Injection on 04/18/2016 at 64 Years.  Comments:  04/18/2016 11:56 EDT - MCVICKER, RN, VERONICA  auto-populated from documented surgical case  Lumbar Epidural Steroid Injection on  03/28/2016 at 64 Years.  Comments:  03/28/2016 9:28 EDT - Alphonzo LemmingsHEBERT, RN, ANN D  auto-populated from documented surgical case  Kyphoplasty on 03/07/2016 at 64 Years.  Comments:  03/07/2016 13:04 EDT - Aileen PilotHastings, RN, Eric  auto-populated from documented surgical case  Knee replacement ([id number]).  Comments:  03/07/2016 10:07 EDT - Alben SpittleGULLEDGE, RN, PATRICIA E  bilateral  Hx of hysterectomy ([id number]).  Breast reduction ([id number]).  History of tonsillectomy [phone]).  Cholecystectomy ([id number]).   Social History        Social & Psychosocial Habits    Alcohol  05/06/2019  Use: Current    Type: Wine    Frequency: 1-2 times per month    Substance Use  02/27/2016  Use: Denies    Tobacco  02/27/2016  Use: Never smoker  .        Physical Examination   Measurements from flowsheet : Measurements   05/06/2019  10:23 EDT Body Mass Index est meas 33.69 kg/m2    Body Mass Index Measured 33.69 kg/m2   05/06/2019 10:15 EDT Height/Length Measured 163 cm    Weight Estimated 89.5 kg    Weight Dosing 89.5 kg         Review / Management   Results review:  Lab results: 05/06/2019 10:23 EDT      Estimated Creatinine Clearance            98.91 mL/min  .       Assessment and Plan   American Society of Anesthesiologists#(ASA) physical status classification:  Class II.    Anesthetic Preoperative Plan     Anesthetic technique: Monitored anesthesia care.     Opioid Assessment: Opioid Na????ve.     Risks discussed.     Signature Line     Electronically Signed on 05/06/2019 10:46 AM EDT   ________________________________________________   Alexya Mcdaris-MD,  Bernell List

## 2019-05-06 NOTE — Nursing Note (Signed)
Adult Patient History Form-Text       Adult Patient History Entered On:  05/06/2019 10:33 EDT    Performed On:  05/06/2019 10:28 EDT by Charletta Cousin, RN, [place]   Patient Identified :   Identification band, Verbal   Patient Identified :   Cindy Buck   Information Given By :   Spouse   Preferred Mode of Communication :   Verbal, Written   In Charge of News (ICON) Name :   Dominica Severin (spouse) [phone]   Pregnancy Status :   N/A   Charletta Cousin RN, Danae Chen R - 05/06/2019 10:28 EDT   Allergies   (As Of: 05/06/2019 10:33:02 EDT)   Allergies (Active)   penicillins  Estimated Onset Date:   Unspecified ; Created By:   Lovena Le, RN, Anderson Malta S; Reaction Status:   Active ; Category:   Drug ; Substance:   penicillins ; Type:   Allergy ; Severity:   Unknown ; Updated By:   Unknown Foley; Reviewed Date:   04/20/2019 12:52 EDT      sulfa drugs  Estimated Onset Date:   Unspecified ; Created By:   Lovena Le, RN, Anderson Malta S; Reaction Status:   Active ; Category:   Drug ; Substance:   sulfa drugs ; Type:   Allergy ; Severity:   Unknown ; Updated By:   Unknown Foley; Reviewed Date:   04/20/2019 12:52 EDT        Problem History   (As Of: 05/06/2019 10:33:02 EDT)   Problems(Active)    Acute lower UTI (urinary tract infection) (SNOMED CT  :[id number] )  Name of Problem:   Acute lower UTI (urinary tract infection) ; Recorder:   LUTZ, RN, CARLA W; Confirmation:   Confirmed ; Classification:   Patient Stated ; Code:   [id number] ; Contributor System:   PowerChart ; Last Updated:   03/04/2016 15:12 EDT ; Life Cycle Date:   03/04/2016 ; Life Cycle Status:   Active ; Vocabulary:   SNOMED CT        Afib (IMO  :[phone] )  Name of Problem:   Afib ; Recorder:   TAYLOR, RN, JENNIFER S; Confirmation:   Confirmed ; Classification:   Medical ; Code:   [phone] ; Contributor System:   Conservation officer, nature ; Last Updated:   02/27/2016 11:48 EDT ; Life Cycle Date:   02/27/2016 ; Life Cycle Status:   Active  ; Vocabulary:   IMO        Anxiety (SNOMED CT  :[id number] )  Name of Problem:   Anxiety ; Recorder:   LUTZ, RN, CARLA W; Confirmation:   Confirmed ; Classification:   Patient Stated ; Code:   [id number] ; Contributor System:   Conservation officer, nature ; Last Updated:   03/04/2016 15:13 EDT ; Life Cycle Date:   03/04/2016 ; Life Cycle Status:   Active ; Vocabulary:   SNOMED CT        Apnea, sleep (SNOMED CT  :[id number] )  Name of Problem:   Apnea, sleep ; Recorder:   LUTZ, RN, CARLA W; Confirmation:   Confirmed ; Classification:   Patient Stated ; Code:   [id number] ; Contributor System:   Conservation officer, nature ; Last Updated:   03/04/2016 15:11 EDT ; Life Cycle Date:   03/04/2016 ; Life Cycle Status:   Active ; Vocabulary:   SNOMED CT        Arthritis (SNOMED  CT  :[id number] )  Name of Problem:   Arthritis ; Recorder:   LUTZ, RN, CARLA W; Confirmation:   Confirmed ; Classification:   Patient Stated ; Code:   [id number]:   [id number] ; Contributor System:   PowerChart ; Last Updated:   03/04/2016 15:12 EDT ; Life Cycle Date:   03/04/2016 ; Life Cycle Status:   Active ; Vocabulary:   SNOMED CT        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 )  Name of Problem:   Cold sores ; Recorder:   LUTZ, RN, CARLA W; Confirmation:   Confirmed ; Classification:   Patient Stated ; Code:   [phone] ; Contributor System:   DietitianowerChart ; Last Updated:   03/04/2016 15:13 EDT ; Life Cycle Date:   03/04/2016 ; Life Cycle Status:   Active ; Vocabulary:   SNOMED CT        Hypertension (SNOMED CT  :[id number] )  Name of Problem:   Hypertension ; Recorder:   LUTZ, RN, CARLA W; Confirmation:   Confirmed ; Classification:   Patient Stated ; Code:   [id number] ; Contributor System:   DietitianowerChart ; Last Updated:   03/04/2016 15:11 EDT ; Life Cycle Date:   03/04/2016 ; Life Cycle Status:   Active ; Vocabulary:   SNOMED CT        Low back pain (SNOMED CT  :[id number] )  Name of Problem:   Low back pain ; Recorder:   HUNTER, RN, DEBORAH L; Confirmation:    Confirmed ; Classification:   Patient Stated ; Code:   [id number] ; Contributor System:   PowerChart ; Last Updated:   03/28/2016 8:49 EDT ; Life Cycle Date:   03/28/2016 ; Life Cycle Status:   Active ; Vocabulary:   SNOMED CT        Lumbar compression fracture (SNOMED CT  :[id number] )  Name of Problem:   Lumbar compression fracture ; Recorder:   LUTZ, RN, CARLA W; Confirmation:   Confirmed ; Classification:   Patient Stated ; Code:   [id number] ; Contributor System:   DietitianowerChart ; Last Updated:   03/04/2016 15:10 EDT ; Life Cycle Date:   03/04/2016 ; Life Cycle Status:   Active ; Vocabulary:   SNOMED CT        Lumbar radicular pain (SNOMED CT  :7371062620177010 )  Name of Problem:   Lumbar radicular pain ; Recorder:   Gardiner RhymeWOOTEN-MD,  THOMAS JR; Confirmation:   Confirmed ; Classification:   Patient Stated ; Code:   [id number] ; Contributor System:   PowerChart ; Last Updated:   03/28/2016 9:17 EDT ; Life Cycle Date:   03/28/2016 ; Life Cycle Status:   Active ; Responsible Provider:   Gardiner RhymeWOOTEN-MD,  THOMAS JR; Vocabulary:   SNOMED CT        PE (pulmonary thromboembolism) (IMO  [phone] )  Name of Problem:   PE (pulmonary thromboembolism) ; Recorder:   Ladona RidgelAYLOR, RN, JENNIFER S; Confirmation:   Confirmed ; Classification:   Medical ; Code:   [phone] ; Contributor System:   PowerChart ; Last Updated:   02/27/2016 11:47 EDT ; Life Cycle Date:   02/27/2016 ; Life Cycle Status:   Active ; Vocabulary:   IMO          Procedure History        -    Procedure History   (As Of: 05/06/2019 10:33:02 EDT)     Anesthesia Minutes:   0 ; Procedure Name:   Hx  of hysterectomy ; Procedure Minutes:   0            Anesthesia Minutes:   0 ; Procedure Name:   Breast reduction ; Procedure Minutes:   0            Anesthesia Minutes:   0 ; Procedure Name:   History of tonsillectomy ; Procedure Minutes:   0            Anesthesia Minutes:   0 ; Procedure Name:   Knee replacement ; Procedure Minutes:   0 ; Comments:      03/07/2016 10:07 EDT - Alben SpittleGULLEDGE, RN, PATRICIA E  bilateral            Procedure Dt/Tm:   03/07/2016 12:30:00 EDT ; Location:   [facility] ; Provider:   Marzella SchleinWORTHINGTON-MD,  CURTIS; Anesthesia Type:   General ; :   FERLA-MD,  BRIAN P; Anesthesia Minutes:   0 ; Procedure Name:   Kyphoplasty ; Procedure Minutes:   27 ; Comments:     03/07/2016 13:04 EDT - Aileen PilotHastings, RN, Eric  auto-populated from documented surgical case ; Clinical Service:   Surgery            Procedure Dt/Tm:   03/28/2016 09:23:00 EDT ; Location:   [facility] ; Provider:   Gardiner RhymeWOOTEN-MD,  THOMAS JR; Anesthesia Type:   Local ; Anesthesia Minutes:   5 ; Procedure Name:   Lumbar Epidural Steroid Injection ; Procedure Minutes:   5 ; Comments:     03/28/2016 9:28 EDT - Alphonzo LemmingsHEBERT, RN, ANN D  auto-populated from documented surgical case ; Clinical Service:   Surgery            Procedure Dt/Tm:   04/18/2016 11:45:00 EDT ; Location:   [facility] ; Provider:   Gardiner RhymeWOOTEN-MD,  THOMAS JR; Anesthesia Type:   Local ; Anesthesia Minutes:   3 ; Procedure Name:   Lumbar Epidural Steroid Injection ; Procedure Minutes:   3 ; Comments:     04/18/2016 11:56 EDT - MCVICKER, RN, VERONICA  auto-populated from documented surgical case ; Clinical Service:   Surgery            Anesthesia Minutes:   0 ; Procedure Name:   Cholecystectomy ; Procedure Minutes:   0            Procedure Dt/Tm:   05/06/2019 ; Anesthesia Minutes:   0 ; Procedure Name:   Cardioversion ; Procedure Minutes:   0 ; Last Reviewed Dt/Tm:   05/06/2019 10:30:29 EDT            Immunizations   Last Tetanus :   Less than 5 years   DEEN, RDurward Parcel, RUTHANN R - 05/06/2019 10:28 EDT   ID Risk Screen Symptoms   Recent Travel History :   No recent travel   Close Contact with COVID-19 ID :   No   Last 14 days COVID-19 ID :   No   TB Symptom Screen :   No symptoms   C. diff Symptom/History ID :   Neither of the above   Sabra HeckDEEN, RN, RUTHANN R - 05/06/2019 10:28 EDT   Bloodless Medicine   Is Blood Transfusion Acceptable to Patient :   Yes   DEEN, RN,  RUTHANN R - 05/06/2019 10:28 EDT   Nutrition   MST Does Your Current Diet Include :   None   MST Have You Been Eating Poorly? :   No  MST Appetite Score :   0    Julio AlmDEEN, RDurward Parcel, RUTHANN R - 05/06/2019 10:28 EDT   Functional   Sensory Deficits :   Other: glasses   ADLs Prior to Admission :   Independent   Sabra HeckDEEN, RN, RUTHANN R - 05/06/2019 10:28 EDT   Social History   Social History   (As Of: 05/06/2019 10:33:02 EDT)   Tobacco:        Never smoker   (Last Updated: 02/27/2016 11:55:25 EDT by Ladona RidgelAYLOR, RN, JENNIFER S)          Alcohol:        Current, Wine, 1-2 times per month   (Last Updated: 05/06/2019 10:32:07 EDT by Julio AlmEEN, RN, Durward ParcelUTHANN R)          Substance Use:        Denies   (Last Updated: 02/27/2016 11:55:43 EDT by Ladona RidgelAYLOR, RN, JENNIFER S)            Spiritual   Do you have a concern that you would like to address with a Chaplain? :   No   Do you have any religious/spiritual/cultural beliefs that could impact the way your care is provided? :   No   DEEN, RN, RUTHANN R - 05/06/2019 10:28 EDT   Harm Screen   Feels Unsafe at Home :   No   Last 3 mo, thoughts killing self/others :   Patient denies   Sabra HeckDEEN, RN, RUTHANN R - 05/06/2019 10:28 EDT   Advance Directive   Advance Directive :   No   Type of Advance Directive :   Living will, Medical durable power of attorney   Patient Wishes to Receive Further Information on Advance Directives :   No   Sabra HeckDEEN, RN, RUTHANN R - 05/06/2019 10:28 EDT   Education   Written Language :   [place] PondsEnglish   Caregiver/Advocate Primary Language :   Georgann HousekeeperEnglish   DEEN, RN, Durward ParcelUTHANN R - 05/06/2019 10:28 EDT   Caregiver/Advocate Language   Patient :   Verbal explanation, Printed materials   DEEN, RN, Meredith StaggersUTHANN R - 05/06/2019 10:28 EDT   Barriers to Learning :   None evident   Responsible Learner Present for Session :   No   Sabra HeckDEEN, RN, RUTHANN R - 05/06/2019 10:28 EDT   Preventative Measures Information   Unit/Room Orientation :   Verbalizes understanding   Environmental Safety :   Verbalizes understanding   Hand Washing :    Verbalizes understanding   Infection Prevention :   Verbalizes understanding   DEEN, RN, Meredith StaggersUTHANN R - 05/06/2019 10:28 EDT   DC Needs   CM Living Situation :   Home with no services   Home Caregiver Name/Relationship :   Jillyn HiddenGary (spouse) [phone]   Julio AlmDEEN, RN, Meredith StaggersUTHANN R - 05/06/2019 10:28 EDT   Valuables and Belongings   Does Patient Have Valuables and Belongings :   Yes   Julio AlmDEEN, RN, Meredith StaggersRUTHANN R - 05/06/2019 10:28 EDT   Valuables and Belongings   At Bedside :   Clothes   DEEN, RN, Meredith StaggersUTHANN R - 05/06/2019 10:28 EDT   Patient Search Completed :   Dala DockNA   DEEN, RN, Meredith StaggersUTHANN R - 05/06/2019 10:28 EDT   Admission Complete   Admission Complete :   Yes   DEEN, RN, RUTHANN R - 05/06/2019 10:28 EDT

## 2019-05-20 NOTE — Nursing Note (Signed)
Adult Patient History Form-Text       Adult Patient History Entered On:  05/20/2019 12:24 EDT    Performed On:  05/20/2019 12:19 EDT by Kyung RuddKENNEDY, RN, STEPHANIE L               General Info   Patient Identified :   Identification band, Verbal   Patient Identified :   Cindy Buck   Information Given By :   Self   Preferred Mode of Communication :   Verbal, Written   Accompanied By :   Spouse   In Charge of News (ICON) Name :   Hilda LiasGary Kirwan [phone] [id number]   Pregnancy Status :   N/A   Has the patient received chemotherapy or immunotherapy (cytotoxic)  in the last 48-72 hours? :   No   In Clinical Trial With Signed Consent for Related Condition :   No signed consent for clinical trial   Is the patient currently (2-3 days) receiving radiation treatment? :   No   KENNEDY, RN, STEPHANIE L - 05/20/2019 12:19 EDT   Allergies   (As Of: 05/20/2019 12:24:21 EDT)   Allergies (Active)   penicillins  Estimated Onset Date:   Unspecified ; Reactions:   Rash ; Created By:   Ladona RidgelAYLOR, RN, JENNIFER S; Reaction Status:   Active ; Category:   Drug ; Substance:   penicillins ; Type:   Allergy ; Severity:   Unknown ; Updated By:   Laruth BouchardAYLOR, RN, JENNIFER S; Reviewed Date:   05/20/2019 12:20 EDT      sulfa drugs  Estimated Onset Date:   Unspecified ; Reactions:   Rash ; Created By:   Ladona RidgelAYLOR, RN, JENNIFER S; Reaction Status:   Active ; Category:   Drug ; Substance:   sulfa drugs ; Type:   Allergy ; Severity:   Unknown ; Updated By:   Laruth BouchardAYLOR, RN, JENNIFER S; Reviewed Date:   05/20/2019 12:20 EDT        Medication History   Medication List   (As Of: 05/20/2019 12:24:21 EDT)   Normal Order    Sodium Chloride 0.9% intravenous solution 1,000 mL  :   Sodium Chloride 0.9% intravenous solution 1,000 mL ; Status:   Ordered ; Ordered As Mnemonic:   Sodium Chloride 0.9% 1,000 mL ; Simple Display Line:   75 mL/hr, IV ; Ordering Provider:   Darla LeschesSTEEN-MD,  MATTHEW B; Catalog Code:   Sodium Chloride 0.9% ; Order Dt/Tm:   05/20/2019 12:06:52 EDT            Prescription/Discharge  Order    metoprolol  :   metoprolol ; Status:   Completed ; Ordered As Mnemonic:   metoprolol succinate 25 mg oral tablet, extended release ; Simple Display Line:   25 mg, 1 tabs, Oral, Daily, 30 tabs, 0 Refill(s) ; Ordering Provider:   Zane HeraldBUNTING-MD,  TROY; Catalog Code:   metoprolol ; Order Dt/Tm:   04/20/2019 13:57:25 EDT            Home Meds    metoprolol  :   metoprolol ; Status:   Documented ; Ordered As Mnemonic:   metoprolol succinate 50 mg oral tablet, extended release ; Simple Display Line:   50 mg, 1 tabs, Oral, Daily, 0 Refill(s) ; Catalog Code:   metoprolol ; Order Dt/Tm:   05/20/2019 12:09:21 EDT          flecainide  :   flecainide ; Status:   Documented ; Ordered As Mnemonic:   flecainide  50 mg oral tablet ; Simple Display Line:   50 mg, 1 tabs, Oral, q12hr, 60 tabs, 0 Refill(s) ; Catalog Code:   flecainide ; Order Dt/Tm:   05/20/2019 12:07:59 EDT          irbesartan  :   irbesartan ; Status:   Documented ; Ordered As Mnemonic:   irbesartan 75 mg oral tablet ; Simple Display Line:   75 mg, 1 tabs, Oral, Daily, 30 tabs, 0 Refill(s) ; Catalog Code:   irbesartan ; Order Dt/Tm:   05/06/2019 10:38:53 EDT          valACYclovir  :   valACYclovir ; Status:   Documented ; Ordered As Mnemonic:   valACYclovir 1 g oral tablet ; Simple Display Line:   1 g, 1 tabs, Oral, Daily, PRN: other (see comment), 14 tabs, 0 Refill(s) ; Catalog Code:   valACYclovir ; Order Dt/Tm:   05/06/2019 10:38:10 EDT          dronedarone  :   dronedarone ; Status:   Completed ; Ordered As Mnemonic:   Multaq 400 mg oral tablet ; Simple Display Line:   400 mg, 1 tabs, Oral, BID, 60 tabs, 0 Refill(s) ; Catalog Code:   dronedarone ; Order Dt/Tm:   05/06/2019 10:37:11 EDT          cetirizine  :   cetirizine ; Status:   Documented ; Ordered As Mnemonic:   ZyrTEC 10 mg oral tablet ; Simple Display Line:   10 mg, 1 tabs, Oral, Daily, 0 Refill(s) ; Catalog Code:   cetirizine ; Order Dt/Tm:   03/28/2016 08:49:01 EDT          potassium chloride  :    potassium chloride ; Status:   Documented ; Ordered As Mnemonic:   potassium chloride 10 mEq oral tablet, extended release ; Simple Display Line:   10 mEq, 1 tabs, Oral, Daily, PRN, 180 tabs, 0 Refill(s) ; Catalog Code:   potassium chloride ; Order Dt/Tm:   02/27/2016 11:54:08 EDT          furosemide  :   furosemide ; Status:   Documented ; Ordered As Mnemonic:   furosemide 20 mg oral tablet ; Simple Display Line:   20 mg, 1 tabs, Oral, Daily, PRN, 0 Refill(s) ; Catalog Code:   furosemide ; Order Dt/Tm:   02/27/2016 11:54:08 EDT          LORazepam  :   LORazepam ; Status:   Documented ; Ordered As Mnemonic:   LORazepam 1 mg oral tablet ; Simple Display Line:   1 mg, 1 tabs, Oral, Daily, PRN: anxiety, 0 Refill(s) ; Catalog Code:   LORazepam ; Order Dt/Tm:   02/27/2016 11:54:08 EDT          rosuvastatin  :   rosuvastatin ; Status:   Documented ; Ordered As Mnemonic:   rosuvastatin 5 mg oral tablet ; Simple Display Line:   10 mg, 2 tabs, Oral, Mo/We/Fr, 0 Refill(s) ; Catalog Code:   rosuvastatin ; Order Dt/Tm:   02/27/2016 11:54:08 EDT          apixaban  :   apixaban ; Status:   Documented ; Ordered As Mnemonic:   Eliquis 5 mg oral tablet ; Simple Display Line:   5 mg, 1 tabs, Oral, BID, 60 tabs, 0 Refill(s) ; Catalog Code:   apixaban ; Order Dt/Tm:   02/27/2016 11:54:08 EDT  Problem History   (As Of: 05/20/2019 12:24:21 EDT)   Problems(Active)    Acute lower UTI (urinary tract infection) (SNOMED CT  :[id number] )  Name of Problem:   Acute lower UTI (urinary tract infection) ; Recorder:   LUTZ, RN, CARLA W; Confirmation:   Confirmed ; Classification:   Patient Stated ; Code:   [id number] ; Contributor System:   PowerChart ; Last Updated:   03/04/2016 15:12 EDT ; Life Cycle Date:   03/04/2016 ; Life Cycle Status:   Active ; Vocabulary:   SNOMED CT        Afib (IMO  [phone] )  Name of Problem:   Afib ; Recorder:   TAYLOR, RN, JENNIFER S; Confirmation:   Confirmed ;  Classification:   Medical ; Code:   [phone] ; Contributor System:   Dietitian ; Last Updated:   02/27/2016 11:48 EDT ; Life Cycle Date:   02/27/2016 ; Life Cycle Status:   Active ; Vocabulary:   IMO        Anxiety (SNOMED CT  :2706237628 )  Name of Problem:   Anxiety ; Recorder:   LUTZ, RN, CARLA W; Confirmation:   Confirmed ; Classification:   Patient Stated ; Code:   [phone] ; Contributor System:   Dietitian ; Last Updated:   03/04/2016 15:13 EDT ; Life Cycle Date:   03/04/2016 ; Life Cycle Status:   Active ; Vocabulary:   SNOMED CT        Apnea, sleep (SNOMED CT  :[id number] )  Name of Problem:   Apnea, sleep ; Recorder:   LUTZ, RN, CARLA W; Confirmation:   Confirmed ; Classification:   Patient Stated ; Code:   [id number] ; Contributor System:   Dietitian ; Last Updated:   03/04/2016 15:11 EDT ; Life Cycle Date:   03/04/2016 ; Life Cycle Status:   Active ; Vocabulary:   SNOMED CT        Arthritis (SNOMED CT  :3154008 )  Name of Problem:   Arthritis ; Recorder:   LUTZ, RN, CARLA W; Confirmation:   Confirmed ; Classification:   Patient Stated ; Code:   [phone] ; Contributor System:   PowerChart ; Last Updated:   03/04/2016 15:12 EDT ; Life Cycle Date:   03/04/2016 ; Life Cycle Status:   Active ; Vocabulary:   SNOMED CT        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 )  Name of Problem:   Cold sores ; Recorder:   LUTZ, RN, CARLA W; Confirmation:   Confirmed ; Classification:   Patient Stated ; Code:   [phone] ; Contributor System:   Dietitian ; Last Updated:   03/04/2016 15:13 EDT ; Life Cycle Date:   03/04/2016 ; Life Cycle Status:   Active ; Vocabulary:   SNOMED CT        Hypercholesterolemia (SNOMED CT  :33825053 )  Name of Problem:   Hypercholesterolemia ; Recorder:   Kyung Rudd, RN, STEPHANIE L; Confirmation:   Confirmed ; Classification:   Patient Stated ; Code:   [phone] ; Contributor System:   Dietitian ; Last Updated:   05/20/2019 12:21 EDT ; Life Cycle Date:   05/20/2019  ; Life Cycle Status:   Active ; Vocabulary:   SNOMED CT        Hypertension (SNOMED CT  :3790240973 )  Name of Problem:   Hypertension ; Recorder:   LUTZ, Charity fundraiser, CARLA W; Confirmation:   Confirmed ; Classification:   Patient Stated ; Code:  [phone] ; Contributor System:   Conservation officer, nature ; Last Updated:   03/04/2016 15:11 EDT ; Life Cycle Date:   03/04/2016 ; Life Cycle Status:   Active ; Vocabulary:   SNOMED CT        Low back pain (SNOMED CT  :098119147 )  Name of Problem:   Low back pain ; Recorder:   HUNTER, RN, Hauula L; Confirmation:   Confirmed ; Classification:   Patient Stated ; Code:   [phone] ; Contributor System:   PowerChart ; Last Updated:   03/28/2016 8:49 EDT ; Life Cycle Date:   03/28/2016 ; Life Cycle Status:   Active ; Vocabulary:   SNOMED CT        Lumbar compression fracture (SNOMED CT  [phone] )  Name of Problem:   Lumbar compression fracture ; Recorder:   LUTZ, RN, CARLA W; Confirmation:   Confirmed ; Classification:   Patient Stated ; Code:   [phone] ; Contributor System:   Conservation officer, nature ; Last Updated:   03/04/2016 15:10 EDT ; Life Cycle Date:   03/04/2016 ; Life Cycle Status:   Active ; Vocabulary:   SNOMED CT        Lumbar radicular pain (SNOMED CT  :16010932 )  Name of Problem:   Lumbar radicular pain ; Recorder:   Simeon Craft; Confirmation:   Confirmed ; Classification:   Patient Stated ; Code:   [phone] ; Contributor System:   PowerChart ; Last Updated:   03/28/2016 9:17 EDT ; Life Cycle Date:   03/28/2016 ; Life Cycle Status:   Active ; Responsible Provider:   Simeon Craft; Vocabulary:   SNOMED CT        PE (pulmonary thromboembolism) (IMO  [phone] )  Name of Problem:   PE (pulmonary thromboembolism) ; Recorder:   Lovena Le, RN, [place]; Confirmation:   Confirmed ; Classification:   Medical ; Code:   [phone] ; Contributor System:   PowerChart ; Last Updated:   02/27/2016 11:47 EDT ; Life Cycle Date:   02/27/2016 ; Life  Cycle Status:   Active ; Vocabulary:   IMO          Procedure History        -    Procedure History   (As Of: 05/20/2019 12:24:21 EDT)     Anesthesia Minutes:   0 ; Procedure Name:   Hx of hysterectomy ; Procedure Minutes:   0 ; Last Reviewed Dt/Tm:   05/20/2019 12:22:50 EDT            Anesthesia Minutes:   0 ; Procedure Name:   Breast reduction ; Procedure Minutes:   0 ; Last Reviewed Dt/Tm:   05/20/2019 12:22:50 EDT            Anesthesia Minutes:   0 ; Procedure Name:   History of tonsillectomy ; Procedure Minutes:   0 ; Last Reviewed Dt/Tm:   05/20/2019 12:22:50 EDT            Anesthesia Minutes:   0 ; Procedure Name:   Knee replacement ; Procedure Minutes:   0 ; Comments:     03/07/2016 10:07 EDT - Antony Salmon, RN, PATRICIA E  bilateral ; Last Reviewed Dt/Tm:   05/20/2019 12:22:50 EDT            Procedure Dt/Tm:   03/07/2016 12:30:00 EDT ; Location:   [facility] ; Provider:   Elinor Parkinson; Anesthesia Type:   General ; :   FERLA-MD,  BRIAN P; Anesthesia Minutes:   0 ; Procedure Name:   Kyphoplasty ; Procedure Minutes:   27 ; Comments:     03/07/2016 13:04 EDT - Aileen Pilot, RN, Eric  auto-populated from documented surgical case ; Clinical Service:   Surgery ; Last Reviewed Dt/Tm:   05/20/2019 12:22:50 EDT            Procedure Dt/Tm:   03/28/2016 09:23:00 EDT ; Location:   [facility] ; Provider:   Gardiner Rhyme; Anesthesia Type:   Local ; Anesthesia Minutes:   5 ; Procedure Name:   Lumbar Epidural Steroid Injection ; Procedure Minutes:   5 ; Comments:     03/28/2016 9:28 EDT - Alphonzo Lemmings, RN, ANN D  auto-populated from documented surgical case ; Clinical Service:   Surgery ; Last Reviewed Dt/Tm:   05/20/2019 12:22:50 EDT            Procedure Dt/Tm:   04/18/2016 11:45:00 EDT ; Location:   [facility] ; Provider:   Gardiner Rhyme; Anesthesia Type:   Local ; Anesthesia Minutes:   3 ; Procedure Name:   Lumbar Epidural Steroid Injection ; Procedure Minutes:   3 ; Comments:     04/18/2016 11:56 EDT - MCVICKER,  RN, VERONICA  auto-populated from documented surgical case ; Clinical Service:   Surgery ; Last Reviewed Dt/Tm:   05/20/2019 12:22:50 EDT            Anesthesia Minutes:   0 ; Procedure Name:   Cholecystectomy ; Procedure Minutes:   0 ; Last Reviewed Dt/Tm:   05/20/2019 12:22:50 EDT            Procedure Dt/Tm:   05/06/2019 ; Anesthesia Minutes:   0 ; Procedure Name:   Cardioversion ; Procedure Minutes:   0 ; Last Reviewed Dt/Tm:   05/20/2019 12:22:50 EDT            Procedure Dt/Tm:   05/20/2019 ; Provider:   Eulah Pont; Anesthesia Minutes:   0 ; Procedure Name:   Cardioversion ; Procedure Minutes:   0 ; Last Reviewed Dt/Tm:   05/20/2019 12:22:50 EDT            Immunizations   Last Tetanus :   Less than 5 years   KENNEDY, RN, STEPHANIE L - 05/20/2019 12:19 EDT   ID Risk Screen Symptoms   Recent Travel History :   No recent travel   Close Contact with COVID-19 ID :   No   Last 14 days COVID-19 ID :   No   TB Symptom Screen :   No symptoms   C. diff Symptom/History ID :   Neither of the above   [place], RN, STEPHANIE L - 05/20/2019 12:19 EDT   Bloodless Medicine   Is Blood Transfusion Acceptable to Patient :   Yes   KENNEDY, RN, STEPHANIE L - 05/20/2019 12:19 EDT   Nutrition   MST Does Your Current Diet Include :   None   MST Have You Recently Lost Weight Without Trying? :   No   MST Weight Loss Score :   0    MST Have You Been Eating Poorly? :   No   MST Appetite Score :   0    MST Score :   0    MST Interpretation :   Not at risk   Kyung Rudd RN, STEPHANIE L - 05/20/2019 12:19 EDT   Functional   Sensory Deficits :  None, Other: glasses   ADLs Prior to Admission :   Independent   Shelda JakesKENNEDY, RN, STEPHANIE L - 05/20/2019 12:19 EDT   Social History   Social History   (As Of: 05/20/2019 12:24:21 EDT)   Tobacco:        Never smoker   (Last Updated: 02/27/2016 11:55:25 EDT by Ladona RidgelAYLOR, RN, JENNIFER S)          Alcohol:        Current, Wine, 1-2 times per month   (Last Updated: 05/06/2019 10:32:07 EDT by Julio AlmEEN, RN, Durward ParcelUTHANN R)           Substance Use:        Denies   (Last Updated: 02/27/2016 11:55:43 EDT by Ladona RidgelAYLOR, RN, JENNIFER S)            Spiritual   Do you have a concern that you would like to address with a Chaplain? :   No   Do you have any religious/spiritual/cultural beliefs that could impact the way your care is provided? :   No   Kyung RuddKENNEDY, RN, STEPHANIE L - 05/20/2019 12:19 EDT   Harm Screen   Injuries/Abuse/Neglect in Household :   Denies   Feels Unsafe at Home :   No   KENNEDY, RN, STEPHANIE L - 05/20/2019 12:19 EDT   Advance Directive   Advance Directive :   Yes   Type of Advance Directive :   Living will, Medical durable power of attorney   Patient Wishes to Receive Further Information on Advance Directives :   No   Shelda JakesKENNEDY, RN, STEPHANIE L - 05/20/2019 12:19 EDT   Education   Written Language :   [place]onds[place]   Caregiver/Advocate Primary Language :   [place] PondsEnglish   Primary Language :   Estevan RyderEnglish   KENNEDY, RN, Barkley BrunsSTEPHANIE L - 05/20/2019 12:19 EDT   Caregiver/Advocate Language   Patient :   Printed materials   Kyung RuddKENNEDY, RN, STEPHANIE L - 05/20/2019 12:19 EDT   Barriers to Learning :   None evident   Teaching Method :   Explanation   Responsible Learner Present for Session :   Yes   Kyung RuddKENNEDY, RN, Barkley BrunsSTEPHANIE L - 05/20/2019 12:19 EDT   Preventative Measures Information   Unit/Room Orientation :   Verbalizes understanding   Environmental Safety :   Verbalizes understanding   Hand Washing :   Verbalizes understanding   Infection Prevention :   Verbalizes understanding   DVT Prophylaxis :   Verbalizes understanding   Isolation Precaution :   Verbalizes understanding   KENNEDY, RN, STEPHANIE L - 05/20/2019 12:19 EDT   DC Needs   CM Living Situation :   Home with no services   Home Caregiver Name/Relationship :   Spouse(gary)   Anticipated Discharge Needs :   None   KENNEDY, RN, STEPHANIE L - 05/20/2019 12:19 EDT   Valuables and Belongings   Does Patient Have Valuables and Belongings :   Yes   Kyung RuddKENNEDY, RN, STEPHANIE L - 05/20/2019 12:19 EDT   Valuables and Belongings    At Bedside :   Clothes   Kyung RuddKENNEDY, RN, STEPHANIE L - 05/20/2019 12:19 EDT   Patient Search Completed :   Lawerance CruelNA   KENNEDY, RN, STEPHANIE L - 05/20/2019 12:19 EDT   Admission Complete   Admission Complete :   Yes   KENNEDY, RN, STEPHANIE L - 05/20/2019 12:19 EDT

## 2019-05-20 NOTE — Anesthesia Pre-Procedure Evaluation (Signed)
Preanesthesia Evaluation        Patient:   Cindy Buck, Cindy Buck             MRN: [id number]            FIN: [id number]               Age:   68 years     Sex:  Female     DOB:  02/25/1951   Associated Diagnoses:   None   Author:   Aldona LentoHERRIN-MD,  Grasiela Jonsson T      Preoperative Information   Procedure/ Case: 68yo for cardioversion   NPO:  NPO greater than 8 hours.    Anesthesia history     Patient's history: negative.     Family's history: negative.        Review of Systems   Respiratory:  Negative.    Cardiovascular:  Negative.       Health Status   Allergies:    Allergic Reactions (Selected)  Unknown  Penicillins- Rash.  Sulfa drugs- Rash.,       (Active and Proposed Allergies Only)  sulfa drugs   (Severity: Unknown, Onset: Unknown)   Reactions: Rash  penicillins   (Severity: Unknown, Onset: Unknown)   Reactions: Rash     Current medications:    Home Medications (10) Active  Eliquis 5 mg oral tablet 5 mg = 1 tabs, Oral, BID  flecainide 50 mg oral tablet 50 mg = 1 tabs, Oral, q12hr  furosemide 20 mg oral tablet 20 mg = 1 tabs, PRN, Oral, Daily  irbesartan 75 mg oral tablet 75 mg = 1 tabs, Oral, Daily  LORazepam 1 mg oral tablet 1 mg = 1 tabs, PRN, Oral, Daily  metoprolol succinate 50 mg oral tablet, extended release 50 mg = 1 tabs, Oral, Daily  potassium chloride 10 mEq oral tablet, extended release 10 mEq = 1 tabs, PRN, Oral, Daily  rosuvastatin 5 mg oral tablet 10 mg = 2 tabs, Oral, Mo/We/Fr  valACYclovir 1 g oral tablet 1 g = 1 tabs, PRN, Oral, Daily  ZyrTEC 10 mg oral tablet 10 mg = 1 tabs, Oral, Daily  ,    Medications (1) Active  Scheduled: (0)  Continuous: (1)  Sodium Chloride 0.9% intravenous solution 1,000 mL  1,000 mL, IV, 75 mL/hr  PRN: (0)     Problem list:    Active Problems (12)  Acute lower UTI (urinary tract infection)   Afib   Anxiety   Apnea, sleep   Arthritis   Cold sores   Hypercholesterolemia   Hypertension   Low back pain   Lumbar compression fracture   Lumbar radicular pain   PE (pulmonary thromboembolism)   ,     Problems   (Active Problems Only)    PE (pulmonary thromboembolism)   (Other: [phone], Onset: --)  Afib   (Other: [phone], Onset: --)  Lumbar compression fracture...   (SNOMED CT: [phone], Onset: --)  Hypertensive disorder   (SNOMED CT: 6606301601501-638-7435, Onset: --)  Apnea, sleep...   (SNOMED CT: [id number], Onset: --)  Acute lower UTI (urinary tract infection)...   (SNOMED CT: [id number], Onset: --)  Arthritis   (SNOMED CT: 71696787278014, Onset: --)  Anxiousness   (SNOMED CT: 9381017510971-487-4488, Onset: --)  Cold sores   (SNOMED CT: 25852773571015, Onset: --)  Low back pain   (SNOMED CT: 824235361416139010, Onset: --)  Radicular pain   (SNOMED CT: 4431540020177010, Onset: --)  Hypercholesterolemia   (SNOMED CT: 8676195023283015, Onset: --)  Histories   Past Medical History:    No active or resolved past medical history items have been selected or recorded.   Procedure history:    Cardioversion on 05/20/2019 at 68 Years.  Cardioversion [phone] [phone]) on 05/06/2019 at 68 Years.  Lumbar Epidural Steroid Injection on 04/18/2016 at 68 Years.  Comments:  04/18/2016 11:56 EDT - MCVICKER, RN, VERONICA  auto-populated from documented surgical case  Lumbar Epidural Steroid Injection on 03/28/2016 at 68 Years.  Comments:  03/28/2016 9:28 EDT - Joellyn Haff, RN, ANN D  auto-populated from documented surgical case  Kyphoplasty on 03/07/2016 at 68 Years.  Comments:  03/07/2016 13:04 EDT - Jacqulyn Ducking, RN, Eric  auto-populated from documented surgical case  Knee replacement ([id number]).  Comments:  03/07/2016 10:07 EDT - Antony Salmon, RN, PATRICIA E  bilateral  Hx of hysterectomy ([id number]).  Breast reduction ([phone]).  History of tonsillectomy [phone]).  Cholecystectomy ([phone]).   Social History        Social & Psychosocial Habits    Alcohol  05/06/2019  Use: Current    Type: Wine    Frequency: 1-2 times per  month    Substance Use  02/27/2016  Use: Denies    Tobacco  02/27/2016  Use: Never smoker  .        Physical Examination   Vital Signs   03/03/622 76:28 EDT Systolic Blood Pressure 315 mmHg    Diastolic Blood Pressure 81 mmHg    Heart Rate Monitored 93 bpm    Respiratory Rate 18 br/min    SpO2 98 %         Vital Signs (last 24 hrs)_____  Last Charted___________  Resp Rate         18 br/min  (JUL 13 12:18)  SBP      130 mmHg  (JUL 13 12:18)  DBP      81 mmHg  (JUL 13 12:18)  SpO2      98 %  (JUL 13 12:18)  Weight      89.8 kg  (JUL 13 13:03)  BMI      33.97  (JUL 13 13:04)     Measurements from flowsheet : Measurements   05/20/2019 13:04 EDT Body Mass Index est meas 33.97 kg/m2    Body Mass Index Measured 33.97 kg/m2   05/20/2019 13:03 EDT Height/Length Estimated 162.6 cm    Weight Measured 89.8 kg    Weight Dosing 89.8 kg      Pain assessment:  Pain Assessment   05/20/2019 12:18 EDT      Numeric Rating Pain Scale 0 = No pain     .    General:          Stress: No acute distress.         Appearance: Obese.    Airway:          Mallampati classification: II (soft palate, fauces, uvula visible).         Thyromental Distance: Normal.         Throat: Within normal limits.    Head:  Normocephalic, Atraumatic.    Neck:  Full range of motion.    Respiratory:  Lungs are clear to auscultation, Breath sounds are equal.    Cardiovascular:  Irregular rhythm.    Gastrointestinal:  Deferred.    Musculoskeletal     Deferred.     Neurologic:  Alert.    Metabolic Equivalents in Exercise Testing      Review / Management   Results review:  No qualifying data available, Lab results: 05/20/2019 13:04 EDT      Estimated Creatinine Clearance            99.08 mL/min  .    Chest x-ray results   ECG interpretation   Cardiology Results   Documentation reviewed      Assessment and Plan   American Society of Anesthesiologists#(ASA) physical status classification:  Class III.    Anesthetic Preoperative Plan     Anesthetic technique: General  anesthesia, Intravenous.     Maintenance airway: natural.     Opioid Assessment: Opioid Na????ve.     Risks discussed: nausea, vomiting, hypotension, allergic reaction, serious complications.     Pt seen and examined and chart reviewed. R/C/B of GA discussed and questions answered. Pt understands and wishes to proceed.   Signature Line     Electronically Signed on 05/20/2019 02:00 PM EDT   ________________________________________________   Aldona LentoHERRIN-MD,  Daria Mcmeekin T

## 2019-05-20 NOTE — Nursing Note (Signed)
Nursing Discharge Summary - Text       Nursing Discharge Summary Entered On:  05/20/2019 15:46 EDT    Performed On:  05/20/2019 15:45 EDT by Kyung Rudd, RN, STEPHANIE L               DC Information   Discharge To :   Home independently   Mode of Discharge :   Wheelchair   Transportation :   Private vehicle   Accompanied By :   Blanche East, RN, Barkley Bruns - 05/20/2019 15:45 EDT   Education   Responsible Learner(s) :   Home Caregiver Name/Relationship: Spouse(gary)        Performed by: Kyung Rudd RN, STEPHANIE L - 05/20/2019 12:19     Home Caregiver Present for Session :   Yes   Teaching Method :   Explanation, Printed materials   Kyung Rudd RN, Barkley Bruns - 05/20/2019 15:45 EDT   Post-Hospital Education Adult Grid   Importance of Follow-Up Visits :   Verbalizes understanding   Postoperative Instructions :   Verbalizes understanding   When to Call Health Care Provider :   [facility] understanding   Kyung Rudd RN, Barkley Bruns - 05/20/2019 15:45 EDT   Medication Education Adult Grid   Med Dosage, Route, Scheduling :   Verbalizes understanding   Kyung Rudd, RN, STEPHANIE L - 05/20/2019 15:45 EDT

## 2019-08-21 LAB — CULTURE, URINE: FINAL REPORT: >100000

## 2021-01-13 LAB — BASIC METABOLIC PANEL
Anion Gap: 18 mmol/L — ABNORMAL HIGH (ref 2–17)
BUN: 19 mg/dL (ref 8–23)
CO2: 23 mmol/L (ref 22–29)
Calcium: 9.6 mg/dL (ref 8.8–10.2)
Chloride: 101 mmol/L (ref 98–107)
Creatinine: 0.7 mg/dL (ref 0.5–1.0)
GFR African American: 102 mL/min/{1.73_m2} (ref >=90)
GFR Non-African American: 88 mL/min/{1.73_m2} — ABNORMAL LOW (ref >=90)
Glucose: 111 mg/dL — ABNORMAL HIGH (ref 70–99)
OSMOLALITY CALCULATED: 286 mOsm/kg (ref 270–287)
Potassium: 4.9 mmol/L (ref 3.5–5.3)
Sodium: 142 mmol/L (ref 135–145)

## 2021-01-13 LAB — MAGNESIUM: Magnesium: 2.2 mg/dL (ref 1.6–2.6)

## 2021-01-15 NOTE — Nursing Note (Signed)
 Adult Admission Assessment - Text       Perioperative Admission Assessment Entered On:  01/15/2021 9:44 EST    Performed On:  01/15/2021 9:36 EST by Debria, RN, Parker [employer]   Information Given By :   Self   PAT Patient Procedure Verification :   Patient name and DOB confirmed with patient, Correct procedure scheduled confirmed with patient, Correct side/site confirmed with patient   Primary Care Physician/Specialists :   DR. LAURINE CANCEL   Day of Procedure Support Person Name :   Matti Killingsworth   Day of Procedure Support Person Phone :   [phone]   Day of Procedure Support Person Relationship :   spouse   Languages :   Isadora Debria, RN, Thersia - 01/15/2021 9:36 EST   Allergies   (As Of: 01/15/2021 09:44:25 EST)   Allergies (Active)   penicillins  Estimated Onset Date:   Unspecified ; Reactions:   Rash ; Created By:   NYLE, RN, STEPHANIE L; Reaction Status:   Active ; Category:   Drug ; Substance:   penicillins ; Type:   Allergy ; Severity:   Unknown ; Updated By:   NYLE RN, COREAN CROME; Reviewed Date:   05/20/2019 12:20 EDT      sulfa drugs  Estimated Onset Date:   Unspecified ; Reactions:   Rash ; Created By:   NYLE, RN, STEPHANIE L; Reaction Status:   Active ; Category:   Drug ; Substance:   sulfa drugs ; Type:   Allergy ; Severity:   Unknown ; Updated By:   NYLE RN, COREAN CROME; Reviewed Date:   05/20/2019 12:20 EDT        Medication History   Medication List   (As Of: 01/15/2021 09:44:25 EST)   Normal Order    Sodium Chloride 0.9% intravenous solution 1,000 mL  :   Sodium Chloride 0.9% intravenous solution 1,000 mL ; Status:   Ordered ; Ordered As Mnemonic:   Sodium Chloride 0.9% 1,000 mL ; Simple Display Line:   100 mL/hr, IV ; Ordering Provider:   LINNIE,  TROY-MD; Catalog Code:   Sodium Chloride 0.9% ; Order Dt/Tm:   01/15/2021 09:36:15 EST            Home Meds    zolpidem  :   zolpidem ; Status:   Documented ; Ordered As Mnemonic:   zolpidem 5 mg oral tablet ; Simple  Display Line:   5 mg, 1 tabs, Oral, Once a Day (at bedtime), PRN, 0 Refill(s) ; Catalog Code:   zolpidem ; Order Dt/Tm:   01/15/2021 09:33:10 EST ; Comment:    THIS MEDICATION IS ASSOCIATED   WITH   AN INCREASED RISK OF FALLS.          magnesium oxide  :   magnesium oxide ; Status:   Documented ; Ordered As Mnemonic:   magnesium oxide 400 mg (240 mg elemental magnesium) oral tablet ; Simple Display Line:   1 tabs, Oral, Daily, 0 Refill(s) ; Catalog Code:   magnesium oxide ; Order Dt/Tm:   01/15/2021 09:32:40 EST          omega-3 polyunsaturated fatty acids  :   omega-3 polyunsaturated fatty acids ; Status:   Documented ; Ordered As Mnemonic:   omega-3 polyunsaturated fatty acids 1000 mg oral capsule ; Simple Display Line:   2,000 mg, 2 caps, Oral, Daily, 0 Refill(s) ;  Catalog Code:   omega-3 polyunsaturated fatty acids ; Order Dt/Tm:   01/15/2021 09:32:57 EST          fluticasone nasal  :   fluticasone nasal ; Status:   Documented ; Ordered As Mnemonic:   Flonase 50 mcg/inh nasal spray ; Simple Display Line:   1 sprays, Nasal, BID, 16 g, 0 Refill(s) ; Catalog Code:   fluticasone nasal ; Order Dt/Tm:   01/15/2021 09:31:18 EST          busPIRone  :   busPIRone ; Status:   Documented ; Ordered As Mnemonic:   busPIRone 5 mg oral tablet ; Simple Display Line:   5 mg, 1 tabs, Oral, BID, 90 tabs, 0 Refill(s) ; Catalog Code:   busPIRone ; Order Dt/Tm:   01/15/2021 09:30:26 EST ; Comment:   SOUND ALIKE / LOOK ALIKE - VERIFY DRUG          acetaminophen  :   acetaminophen ; Status:   Documented ; Ordered As Mnemonic:   acetaminophen ; Simple Display Line:   650 mg, Oral, q6hr, PRN: as needed for pain, 0 Refill(s) ; Catalog Code:   acetaminophen ; Order Dt/Tm:   01/15/2021 09:29:50 EST          metoprolol  :   metoprolol ; Status:   Documented ; Ordered As Mnemonic:   metoprolol succinate 50 mg oral tablet, extended release ; Simple Display Line:   50 mg, 1 tabs, Oral, Daily, 0 Refill(s) ; Catalog Code:   metoprolol ; Order Dt/Tm:    05/20/2019 12:09:21 EDT          flecainide  :   flecainide ; Status:   Documented ; Ordered As Mnemonic:   flecainide 50 mg oral tablet ; Simple Display Line:   50 mg, 1 tabs, Oral, q12hr, 60 tabs, 0 Refill(s) ; Catalog Code:   flecainide ; Order Dt/Tm:   05/20/2019 12:07:59 EDT          irbesartan  :   irbesartan ; Status:   Documented ; Ordered As Mnemonic:   irbesartan 75 mg oral tablet ; Simple Display Line:   75 mg, 1 tabs, Oral, Daily, 30 tabs, 0 Refill(s) ; Catalog Code:   irbesartan ; Order Dt/Tm:   05/06/2019 10:38:53 EDT          valACYclovir  :   valACYclovir ; Status:   Voided ; Ordered As Mnemonic:   valACYclovir 1 g oral tablet ; Simple Display Line:   1 g, 1 tabs, Oral, Daily, PRN: other (see comment), 14 tabs, 0 Refill(s) ; Catalog Code:   valACYclovir ; Order Dt/Tm:   05/06/2019 10:38:10 EDT          cetirizine  :   cetirizine ; Status:   Documented ; Ordered As Mnemonic:   ZyrTEC 10 mg oral tablet ; Simple Display Line:   10 mg, 1 tabs, Oral, Daily, 0 Refill(s) ; Catalog Code:   cetirizine ; Order Dt/Tm:   03/28/2016 08:49:01 EDT          potassium chloride  :   potassium chloride ; Status:   Documented ; Ordered As Mnemonic:   potassium chloride 10 mEq oral tablet, extended release ; Simple Display Line:   10 mEq, 1 tabs, Oral, Daily, PRN, 180 tabs, 0 Refill(s) ; Catalog Code:   potassium chloride ; Order Dt/Tm:   02/27/2016 11:54:08 EDT          furosemide  :  furosemide ; Status:   Documented ; Ordered As Mnemonic:   furosemide 20 mg oral tablet ; Simple Display Line:   20 mg, 1 tabs, Oral, Daily, PRN, 0 Refill(s) ; Catalog Code:   furosemide ; Order Dt/Tm:   02/27/2016 11:54:08 EDT          LORazepam  :   LORazepam ; Status:   Voided ; Ordered As Mnemonic:   LORazepam 1 mg oral tablet ; Simple Display Line:   1 mg, 1 tabs, Oral, Daily, PRN: anxiety, 0 Refill(s) ; Catalog Code:   LORazepam ; Order Dt/Tm:   02/27/2016 11:54:08 EDT          rosuvastatin  :   rosuvastatin ; Status:   Documented ;  Ordered As Mnemonic:   rosuvastatin 5 mg oral tablet ; Simple Display Line:   10 mg, 2 tabs, Oral, Mo/We/Fr, 0 Refill(s) ; Catalog Code:   rosuvastatin ; Order Dt/Tm:   02/27/2016 11:54:08 EDT          apixaban  :   apixaban ; Status:   Documented ; Ordered As Mnemonic:   Eliquis 5 mg oral tablet ; Simple Display Line:   5 mg, 1 tabs, Oral, BID, 60 tabs, 0 Refill(s) ; Catalog Code:   apixaban ; Order Dt/Tm:   02/27/2016 11:54:08 EDT            Problem History   (As Of: 01/15/2021 09:44:25 EST)   Problems(Active)    Acute lower UTI (urinary tract infection) (SNOMED CT  :[id number] )  Name of Problem:   Acute lower UTI (urinary tract infection) ; Recorder:   LUTZ, RN, CARLA W; Confirmation:   Confirmed ; Classification:   Patient Stated ; Code:   [id number] ; Contributor System:   PowerChart ; Last Updated:   03/04/2016 15:12 EDT ; Life Cycle Date:   03/04/2016 ; Life Cycle Status:   Active ; Vocabulary:   SNOMED CT        Afib (IMO  :[phone] )  Name of Problem:   Afib ; Recorder:   TAYLOR, RN, JENNIFER S; Confirmation:   Confirmed ; Classification:   Medical ; Code:   [phone] ; Contributor System:   PowerChart ; Last Updated:   02/27/2016 11:48 EDT ; Life Cycle Date:   02/27/2016 ; Life Cycle Status:   Active ; Vocabulary:   IMO        Anxiety (SNOMED CT  :[id number] )  Name of Problem:   Anxiety ; Recorder:   LUTZ, RN, CARLA W; Confirmation:   Confirmed ; Classification:   Patient Stated ; Code:   [phone] ; Contributor System:   PowerChart ; Last Updated:   03/04/2016 15:13 EDT ; Life Cycle Date:   03/04/2016 ; Life Cycle Status:   Active ; Vocabulary:   SNOMED CT        Apnea, sleep (SNOMED CT  :[id number] )  Name of Problem:   Apnea, sleep ; Recorder:   LUTZ, RN, CARLA W; Confirmation:   Confirmed ; Classification:   Patient Stated ; Code:   [id number] ; Contributor System:   PowerChart ; Last Updated:   03/04/2016 15:11  EDT ; Life Cycle Date:   03/04/2016 ; Life Cycle Status:   Active ; Vocabulary:   SNOMED CT        Arthritis (SNOMED CT  :2721985 )  Name of Problem:   Arthritis ; Recorder:   LUTZ, RN, CARLA W; Confirmation:   Confirmed ; Classification:  Patient Stated ; Code:   2721985 ; Contributor System:   PowerChart ; Last Updated:   03/04/2016 15:12 EDT ; Life Cycle Date:   03/04/2016 ; Life Cycle Status:   Active ; Vocabulary:   SNOMED CT        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 )  Name of Problem:   Cold sores ; Recorder:   LUTZ, RN, CARLA W; Confirmation:   Confirmed ; Classification:   Patient Stated ; Code:   [phone] ; Contributor System:   PowerChart ; Last Updated:   03/04/2016 15:13 EDT ; Life Cycle Date:   03/04/2016 ; Life Cycle Status:   Active ; Vocabulary:   SNOMED CT        Hypercholesterolemia (SNOMED CT  :76716984 )  Name of Problem:   Hypercholesterolemia ; Recorder:   NYLE, RN, STEPHANIE L; Confirmation:   Confirmed ; Classification:   Patient Stated ; Code:   76716984 ; Contributor System:   PowerChart ; Last Updated:   05/20/2019 12:21 EDT ; Life Cycle Date:   05/20/2019 ; Life Cycle Status:   Active ; Vocabulary:   SNOMED CT        Hypertension (SNOMED CT  :8784255987 )  Name of Problem:   Hypertension ; Recorder:   LUTZ, RN, CARLA W; Confirmation:   Confirmed ; Classification:   Patient Stated ; Code:   [phone] ; Contributor System:   PowerChart ; Last Updated:   03/04/2016 15:11 EDT ; Life Cycle Date:   03/04/2016 ; Life Cycle Status:   Active ; Vocabulary:   SNOMED CT        Low back pain (SNOMED CT  :583860989 )  Name of Problem:   Low back pain ; Recorder:   HUNTER, RN, DEBORAH L; Confirmation:   Confirmed ; Classification:   Patient Stated ; Code:   [phone] ; Contributor System:   PowerChart ; Last Updated:   03/28/2016 8:49 EDT ; Life Cycle Date:   03/28/2016 ; Life Cycle Status:   Active ; Vocabulary:   SNOMED CT        Lumbar compression fracture (SNOMED CT  :[phone] )   Name of Problem:   Lumbar compression fracture ; Recorder:   LUTZ, RN, CARLA W; Confirmation:   Confirmed ; Classification:   Patient Stated ; Code:   [phone] ; Contributor System:   PowerChart ; Last Updated:   03/04/2016 15:10 EDT ; Life Cycle Date:   03/04/2016 ; Life Cycle Status:   Active ; Vocabulary:   SNOMED CT        Lumbar radicular pain (SNOMED CT  :79822989 )  Name of Problem:   Lumbar radicular pain ; Recorder:   ABRON NED JR-MD; Confirmation:   Confirmed ; Classification:   Patient Stated ; Code:   [id number] ; Contributor System:   PowerChart ; Last Updated:   03/28/2016 9:17 EDT ; Life Cycle Date:   03/28/2016 ; Life Cycle Status:   Active ; Responsible Provider:   ABRON NED JR-MD; Vocabulary:   SNOMED CT        PE (pulmonary thromboembolism) (IMO  :669070 )  Name of Problem:   PE (pulmonary thromboembolism) ; Recorder:   WADDELL, RN, JENNIFER S; Confirmation:   Confirmed ; Classification:   Medical ; Code:   [phone] ; Contributor System:   PowerChart ; Last Updated:   02/27/2016 11:47 EDT ; Life Cycle Date:   02/27/2016 ; Life Cycle Status:   Active ;  Vocabulary:   IMO          Procedure History        -    Procedure History   (As Of: 01/15/2021 09:46:36 EST)     Anesthesia Minutes:   0 ; Procedure Name:   Hx of hysterectomy ; Procedure Minutes:   0            Anesthesia Minutes:   0 ; Procedure Name:   Breast reduction ; Procedure Minutes:   0            Anesthesia Minutes:   0 ; Procedure Name:   History of tonsillectomy ; Procedure Minutes:   0            Anesthesia Minutes:   0 ; Procedure Name:   Knee replacement ; Procedure Minutes:   0 ; Comments:     03/07/2016 10:07 EDT - CAROLENE, RN, PATRICIA E  bilateral            Procedure Dt/Tm:   03/07/2016 12:30:00 EDT ; Location:   [facility] ; Provider:   ALVAN BRASIL; Anesthesia Type:   General ; :   FERLA-MD,  BRIAN P; Anesthesia Minutes:   0 ; Procedure Name:   Kyphoplasty ; Procedure Minutes:   27 ; Comments:      03/07/2016 13:04 EDT - [place], RN, Eric  auto-populated from documented surgical case ; Clinical Service:   Surgery            Procedure Dt/Tm:   03/28/2016 09:23:00 EDT ; Location:   [facility] ; Provider:   JAQUELINE DEBBY RADDLE; Anesthesia Type:   Local ; Anesthesia Minutes:   5 ; Procedure Name:   Lumbar Epidural Steroid Injection ; Procedure Minutes:   5 ; Comments:     03/28/2016 9:28 EDT - GARA, RN, ANN D  auto-populated from documented surgical case ; Clinical Service:   Surgery            Procedure Dt/Tm:   04/18/2016 11:45:00 EDT ; Location:   [facility] ; Provider:   JAQUELINE DEBBY RADDLE; Anesthesia Type:   Local ; Anesthesia Minutes:   3 ; Procedure Name:   Lumbar Epidural Steroid Injection ; Procedure Minutes:   3 ; Comments:     04/18/2016 11:56 EDT - MCVICKER, RN, VERONICA  auto-populated from documented surgical case ; Clinical Service:   Surgery            Anesthesia Minutes:   0 ; Procedure Name:   Cholecystectomy ; Procedure Minutes:   0            Procedure Dt/Tm:   05/06/2019 ; Anesthesia Minutes:   0 ; Procedure Name:   Cardioversion ; Procedure Minutes:   0            Procedure Dt/Tm:   05/20/2019 ; Provider:   JOAQUIN COUGH B; Anesthesia Minutes:   0 ; Procedure Name:   Cardioversion ; Procedure Minutes:   0            History Confirmation   Problem History Changes PAT :   No   Procedure History Changes PAT :   No   Debria RNThersia - 01/15/2021 9:36 EST   Anesthesia/Sedation   Anesthesia History :   Prior general anesthesia   SN - Malignant Hyperthermia :   Denies   Previous Problem with Anesthesia :   None   Moderate Sedation  History :   Prior sedation for procedure   Previous Problem With Sedation :   None   Symptoms of Sleep Apnea :   Hypertension, Observed apnea during sleep   Symptoms of Sleep Apnea Score (STOP BANG) :   2    Shortness of Breath Indicator :   Shortness of breath with ordinary activity   Debria OBIE Rouse - 01/15/2021 9:45 EST   Bloodless Medicine    Is Blood Transfusion Acceptable to Patient :   Yes   Debria, RN, Alexis - 01/15/2021 9:45 EST   ID Risk Screen Symptoms   Recent Travel History :   No recent travel   Close Contact with COVID-19 ID :   No   Last 14 days COVID-19 ID :   No   TB Symptom Screen :   No symptoms   C. diff Symptom/History ID :   Neither of the above   Debria, RNRouse - 01/15/2021 9:45 EST   Social History   Social History   (As Of: 01/15/2021 09:46:36 EST)   Tobacco:        Never smoker   (Last Updated: 02/27/2016 11:55:25 EDT by WADDELL, RN, JENNIFER S)          Alcohol:        Current, Wine, 1-2 times per month   (Last Updated: 05/06/2019 10:32:07 EDT by SEDALIA, RN, JOHNSON SAUNDERS)          Substance Use:        Denies   (Last Updated: 02/27/2016 11:55:43 EDT by WADDELL, RN, JENNIFER S)            Advance Directive   Advance Directive :   Yes   Type of Advance Directive :   Living will, Medical durable power of attorney   Location of Advance Directive :   Family to bring in copy from home   [place],  - 01/15/2021 9:45 EST   Harm Screen   Feels Safe Where Live :   Yes   Last 3 mo, thoughts killing self/others :   Patient denies   Debria OBIE Rouse - 01/15/2021 9:45 EST

## 2021-01-15 NOTE — Anesthesia Pre-Procedure Evaluation (Signed)
Preanesthesia Evaluation        Patient:   Cindy Buck, Cindy Buck             MRN: [id number]            FIN: [id number]               Age:   70 years     Sex:  Female     DOB:  [birth year]   Associated Diagnoses:   None   Author:   Celine Mans,  Maida Widger-MD      Preoperative Information   NPO:  NPO greater than 8 hours.    Anesthesia history     Patient's history: negative.     Family's history: negative.        Review of Systems   Respiratory:       Sleep apnea: CPAP.    Cardiovascular:  Hypertension.         Arrhythmia: Irregular.    Gastrointestinal:  GERD/heartburn.       Health Status   Allergies:    Allergic Reactions (Selected)  Unknown  Penicillins- Rash.  Sulfa drugs- Rash.,    Allergies    (Active and Proposed Allergies Only)  sulfa drugs   (Severity: Unknown, Onset: Unknown)   Reactions: Rash  penicillins   (Severity: Unknown, Onset: Unknown)   Reactions: Rash     Current medications:    Home Medications (14) Active  acetaminophen 650 mg, PRN, Oral, q6hr  busPIRone 5 mg oral tablet 5 mg = 1 tabs, Oral, BID  Eliquis 5 mg oral tablet 5 mg = 1 tabs, Oral, BID  flecainide 50 mg oral tablet 50 mg = 1 tabs, Oral, q12hr  Flonase 50 mcg/inh nasal spray 1 sprays, Nasal, BID  furosemide 20 mg oral tablet 20 mg = 1 tabs, PRN, Oral, Daily  irbesartan 75 mg oral tablet 75 mg = 1 tabs, Oral, Daily  magnesium oxide 400 mg (240 mg elemental magnesium) oral tablet 1 tabs, Oral, Daily  metoprolol succinate 50 mg oral tablet, extended release 50 mg = 1 tabs, Oral, Daily  omega-3 polyunsaturated fatty acids 1000 mg oral capsule 2,000 mg = 2 caps, Oral, Daily  potassium chloride 10 mEq oral tablet, extended release 10 mEq = 1 tabs, PRN, Oral, Daily  rosuvastatin 5 mg oral tablet 10 mg = 2 tabs, Oral, Mo/We/Fr  zolpidem 5 mg oral tablet 5 mg = 1 tabs, PRN, Oral, Once a Day (at bedtime)  ZyrTEC 10 mg oral tablet 10 mg = 1 tabs, Oral, Daily  ,    Medications (1) Active  Scheduled: (0)  Continuous: (1)  Sodium Chloride 0.9% intravenous solution  1,000 mL  1,000 mL, IV, 100 mL/hr  PRN: (0)     Problem list:    Active Problems (13)  Acute lower UTI (urinary tract infection)   Afib   Anxiety   Apnea, sleep   Arthritis   Cold sores   Hypercholesterolemia   Hypertension   Low back pain   Lumbar compression fracture   Lumbar radicular pain   PE (pulmonary thromboembolism)   Shortness of breath   ,    Problems   (Active Problems Only)    PE (pulmonary thromboembolism)   (Other: 482707, Onset: --)  Afib   (Other: 867544, Onset: --)  Lumbar compression fracture...   (SNOMED CT: [phone], Onset: --)  Hypertensive disorder   (SNOMED CT: 7680881103, Onset: --)  Apnea, sleep...   (SNOMED CT: [id number], Onset: --)  Acute lower  UTI (urinary tract infection)...   (SNOMED CT: [id number], Onset: --)  Arthritis   (SNOMED CT: 5726203, Onset: --)  Anxiousness   (SNOMED CT: 5597416384, Onset: --)  Cold sores   (SNOMED CT: 5364680, Onset: --)  Low back pain   (SNOMED CT: 321224825, Onset: --)  Radicular pain   (SNOMED CT: 00370488, Onset: --)  Hypercholesterolemia   (SNOMED CT: 89169450, Onset: --)  Shortness of breath   (Other: 27423, Onset: --)        Histories   Past Medical History:    No active or resolved past medical history items have been selected or recorded.   Procedure history:    Cardioversion on 05/20/2019 at 67 Years.  Cardioversion [phone] BB5) on 05/06/2019 at 67 Years.  Lumbar Epidural Steroid Injection on 04/18/2016 at 64 Years.  Comments:  04/18/2016 11:56 EDT - MCVICKER, RN, VERONICA  auto-populated from documented surgical case  Lumbar Epidural Steroid Injection on 03/28/2016 at 64 Years.  Comments:  03/28/2016 9:28 EDT - Alphonzo Lemmings, RN, ANN D  auto-populated from documented surgical case  Kyphoplasty on 03/07/2016 at 64 Years.  Comments:  03/07/2016 13:04 EDT - Aileen Pilot, RN, Eric  auto-populated from documented surgical case  Knee replacement  ([id number]).  Comments:  03/07/2016 10:07 EDT - Alben Spittle, RN, PATRICIA E  bilateral  Hx of hysterectomy ([id number]).  Breast reduction ([id number]).  History of tonsillectomy [phone]).  Cholecystectomy (30940768).   Social History        Social & Psychosocial Habits    Alcohol  05/06/2019  Use: Current    Type: Wine    Frequency: 1-2 times per month    Substance Use  02/27/2016  Use: Denies    Tobacco  02/27/2016  Use: Never smoker  .     Symptoms of Sleep Apnea Score: PAT Documentation: 2                        01/15/2021 9:36 EST     PONV Risk Score: PAT Documentation: 2                        01/15/2021 9:46 EST        Physical Examination   Vital Signs   01/15/2021 9:50 EST Temperature Oral 37.1 degC   01/15/2021 9:46 EST Systolic Blood Pressure 151 mmHg  HI    Diastolic Blood Pressure 87 mmHg    Temperature Axillary 37.2 degC  HI    Peripheral Pulse Rate 101 bpm  HI    Heart Rate Monitored 103 bpm  HI    Respiratory Rate 12 br/min    Mean Arterial Pressure, Cuff 115 mmHg  HI    SpO2 100 %         Vital Signs (last 24 hrs)_____  Last Charted___________  Temp Oral     37.1 degC  (MAR 11 09:50)  Heart Rate Peripheral   H 101bpm  (MAR 11 09:46)  Resp Rate         12 br/min  (MAR 11 09:46)  SBP      H  (MAR 11 09:46)  DBP      87 mmHg  (MAR 11 09:46)  SpO2      100 %  (MAR 11 09:46)  Weight      88.4 kg  (MAR 11 09:28)  Height      162.6 cm  (MAR 11 09:28)  BMI      33.44  (  MAR 11 09:29)     Measurements from flowsheet : Measurements   01/15/2021 9:29 EST Body Mass Index est meas 33.44 kg/m2   01/15/2021 9:29 EST Body Mass Index Measured 33.44 kg/m2   01/15/2021 9:28 EST Height/Length Measured 162.6 cm    Patient Stated Height/Length 162.6 cm    Weight Measured 88.4 kg    Weight Dosing 88.4 kg      Pain assessment:  Pain Assessment   01/15/2021 9:50 EST       Numeric Rating Pain Scale 0 = No pain     .    General:          Stress: No  acute distress.         Appearance: Obese.    Airway:          Mallampati classification: II (soft palate, fauces, uvula visible).         Thyromental Distance: Normal.         Mouth: Teeth ( Crown ).         Throat: Within normal limits.    Head:  Normocephalic, Atraumatic.    Neck:  Full range of motion.    Respiratory:  Lungs are clear to auscultation, Breath sounds are equal.    Cardiovascular:  Normal rate.    Gastrointestinal:  Deferred.    Musculoskeletal     Deferred.     Integumentary:  Intact, Warm.    Neurologic:  Alert, Oriented.       Review / Management   Results review:     No qualifying data available, Lab results: 01/15/2021 9:29 EST       Estimated Creatinine Clearance            81.67 mL/min  .       Assessment and Plan   American Society of Anesthesiologists#(ASA) physical status classification:  Class III.    Anesthetic Preoperative Plan     Anesthetic technique: Intravenous.     Maintenance airway: Mask.     Opioid Assessment: Opioid Na????ve.     Risks discussed: nausea, vomiting, headache, sore throat, dental injury, hypotension, allergic reaction, serious complications.     Signature Line     Electronically Signed on 01/15/2021 10:55 AM EST   ________________________________________________   Lorinda Creed

## 2021-01-15 NOTE — Nursing Note (Signed)
Nursing Discharge Summary - Text       Nursing Discharge Summary Entered On:  01/15/2021 11:48 EST    Performed On:  01/15/2021 11:48 EST by Adan Sis, RN, ERICA C               DC Information   Discharge To :   Home independently   Mode of Discharge :   Wheelchair   Transportation :   Private vehicle   Accompanied By :   Sharin Grave, RN, ERICA C - 01/15/2021 11:48 EST

## 2021-01-30 LAB — CULTURE, URINE: FINAL REPORT: >100000

## 2021-02-02 LAB — BASIC METABOLIC PANEL
Anion Gap: 14 mmol/L (ref 2–17)
BUN: 15 mg/dL (ref 8–23)
CO2: 27 mmol/L (ref 22–29)
Calcium: 9.3 mg/dL (ref 8.8–10.2)
Chloride: 101 mmol/L (ref 98–107)
Creatinine: 0.6 mg/dL (ref 0.5–1.0)
GFR African American: 108 mL/min/{1.73_m2} (ref >=90)
GFR Non-African American: 93 mL/min/{1.73_m2} (ref >=90)
Glucose: 114 mg/dL — ABNORMAL HIGH (ref 70–99)
OSMOLALITY CALCULATED: 285 mOsm/kg (ref 270–287)
Potassium: 4.7 mmol/L (ref 3.5–5.3)
Sodium: 142 mmol/L (ref 135–145)

## 2021-02-02 LAB — N TERMINAL PROBNP (AKA NTPROBNP): NT Pro-BNP: 277 pg/mL — ABNORMAL HIGH (ref 0–125)

## 2021-02-23 NOTE — Anesthesia Pre-Procedure Evaluation (Signed)
Preanesthesia Evaluation        Patient:   Cindy Buck, Cindy Buck             MRN: [id number]            FIN: [id number]               Age:   70 years     Sex:  Female     DOB:  1951-01-08   Associated Diagnoses:   None   Author:   Peter Minium      Health Status   Allergies:    Allergic Reactions (Selected)  Unknown  Penicillins- Rash.  Sulfa drugs- Rash.,    Allergies    (Active and Proposed Allergies Only)  sulfa drugs   (Severity: Unknown, Onset: Unknown)   Reactions: Rash  penicillins   (Severity: Unknown, Onset: Unknown)   Reactions: Rash     Current medications:    Home Medications (14) Active  acetaminophen 650 mg, PRN, Oral, q6hr  busPIRone 5 mg oral tablet 10 mg = 2 tabs, PRN, Oral, BID  Eliquis 5 mg oral tablet 5 mg = 1 tabs, Oral, BID  estradiol 0.1 mg/g vaginal cream See Instructions  flecainide 50 mg oral tablet 50 mg = 1 tabs, Oral, q12hr  Flonase 50 mcg/inh nasal spray 1 sprays, Nasal, Daily  furosemide 20 mg oral tablet 20 mg = 1 tabs, PRN, Oral, Daily  irbesartan 75 mg oral tablet 75 mg = 1 tabs, Oral, Daily  magnesium oxide 400 mg (241.3 mg elemental magnesium) oral tablet 400 mg = 1 tabs, Oral, Daily  metoprolol succinate 50 mg oral tablet, extended release 25 mg = 0.5 tabs, Oral, Daily  omega-3 polyunsaturated fatty acids 1000 mg oral capsule 2,000 mg = 2 caps, Oral, BID  potassium chloride 10 mEq oral tablet, extended release 10 mEq = 1 tabs, PRN, Oral, Daily  rosuvastatin 5 mg oral tablet 10 mg = 2 tabs, Oral, Mo/We/Fr  zolpidem 5 mg oral tablet 5 mg = 1 tabs, PRN, Oral, Once a Day (at bedtime)  ,    Medications (1) Active  Scheduled: (0)  Continuous: (1)  Sodium Chloride 0.9% intravenous solution 1,000 mL  1,000 mL, IV, 100 mL/hr  PRN: (0)     Problem list:    Active Problems (13)  Acute lower UTI (urinary tract infection)   Afib   Anxiety   Apnea, sleep   Arthritis   Cold sores   Hypercholesterolemia   Hypertension   Low back pain   Lumbar compression fracture   Lumbar radicular pain   PE  (pulmonary thromboembolism)   Shortness of breath   ,    Problems   (Active Problems Only)    PE (pulmonary thromboembolism)   (Other: 789381, Onset: --)  Afib   (Other: 017510, Onset: --)  Lumbar compression fracture...   (SNOMED CT: [phone], Onset: --)  Hypertensive disorder   (SNOMED CT: 9326712458, Onset: --)  Apnea, sleep...   (SNOMED CT: [id number], Onset: --)  Acute lower UTI (urinary tract infection)...   (SNOMED CT: [id number], Onset: --)  Arthritis   (SNOMED CT: 4481856, Onset: --)  Anxiousness   (SNOMED CT: 3149702637, Onset: --)  Cold sores   (SNOMED CT: 8588502, Onset: --)  Low back pain   (SNOMED CT: 774128786, Onset: --)  Radicular pain   (SNOMED CT: 76720947, Onset: --)  Hypercholesterolemia   (SNOMED CT: 09628366, Onset: --)  Shortness of breath   (Other: 27423, Onset: --)  Histories   Past Medical History:    No active or resolved past medical history items have been selected or recorded.   Procedure history:    TEE on 02/23/2021 at 69 Years.  Cardioversion on 05/20/2019 at 67 Years.  Cardioversion [phone] [phone]) on 05/06/2019 at 67 Years.  Lumbar Epidural Steroid Injection on 04/18/2016 at 64 Years.  Comments:  04/18/2016 11:56 EDT - MCVICKER, RN, VERONICA  auto-populated from documented surgical case  Lumbar Epidural Steroid Injection on 03/28/2016 at 64 Years.  Comments:  03/28/2016 9:28 EDT - Alphonzo Lemmings, RN, ANN D  auto-populated from documented surgical case  Kyphoplasty on 03/07/2016 at 64 Years.  Comments:  03/07/2016 13:04 EDT - Aileen Pilot, RN, Eric  auto-populated from documented surgical case  Knee replacement ([id number]).  Comments:  03/07/2016 10:07 EDT - Alben Spittle, RN, PATRICIA E  bilateral  Hx of hysterectomy ([id number]).  Breast reduction ([id number]).  History of tonsillectomy [id number]).  Cholecystectomy  (17510258).   Social History        Social & Psychosocial Habits    Alcohol  05/06/2019  Use: Current    Type: Wine    Frequency: 1-2 times per month    Substance Use  02/27/2016  Use: Denies    Tobacco  02/27/2016  Use: Never smoker  .        Physical Examination   Vital Signs   02/23/2021 11:45 EDT Systolic Blood Pressure 146 mmHg  HI    Diastolic Blood Pressure 75 mmHg    Temperature Oral 36.6 degC    Heart Rate Monitored 65 bpm    Respiratory Rate 18 br/min    SpO2 100 %         Vital Signs (last 24 hrs)_____  Last Charted___________  Temp Oral     36.6 degC  (APR 19 11:45)  Resp Rate         18 br/min  (APR 19 11:45)  SBP      H  (APR 19 11:45)  DBP      75 mmHg  (APR 19 11:45)  SpO2      100 %  (APR 19 11:45)  Weight      85.9 kg  (APR 19 11:27)  Height      162.6 cm  (APR 19 11:27)  BMI      32.49  (APR 19 11:27)     Measurements from flowsheet : Measurements   02/23/2021 11:27 EDT Body Mass Index est meas 32.49 kg/m2    Body Mass Index Measured 32.49 kg/m2   02/23/2021 11:27 EDT Height/Length Measured 162.6 cm    Patient Stated Height/Length 162.6 cm    Weight Measured 85.9 kg    Weight Dosing 85.9 kg      General:       Appearance: Obese.    Airway:       Thyromental Distance: Normal.       Review / Management   Results review:     No qualifying data available, Lab results: 02/23/2021 11:27 EDT      Estimated Creatinine Clearance            93.88 mL/min  .       Assessment and Plan   American Society of Anesthesiologists#(ASA) physical status classification:  Class III.    Anesthetic Preoperative Plan     Anesthetic technique: Monitored anesthesia care.     Risks discussed: nausea, vomiting, headache, sore throat, dental injury, hypotension, allergic reaction, serious complications.     Signature  Line     Electronically Signed on 02/23/2021 12:52 PM EDT   ________________________________________________   Dravon Nott,  Tariyah Pendry-MD

## 2021-02-23 NOTE — Nursing Note (Signed)
Adult Patient History Form-Text       Adult Patient History Entered On:  02/23/2021 12:02 EDT    Performed On:  02/23/2021 11:58 EDT by Cephus Slater, RN, MELLA O               General Info   Patient Identified :   Identification band, Verbal   Patient Identified :   Jamielyn   Information Given By :   Self   Preferred Mode of Communication :   Verbal, Written   Accompanied By :   None   Pregnancy Status :   N/A   Has the patient received chemotherapy or immunotherapy (cytotoxic)  in the last 48-72 hours? :   No   In Clinical Trial With Signed Consent for Related Condition :   No signed consent for clinical trial   Is the patient currently (2-3 days) receiving radiation treatment? :   No   SCHOFIELD, RN, MELLA O - 02/23/2021 11:58 EDT   Allergies   (As Of: 02/23/2021 12:02:08 EDT)   Allergies (Active)   penicillins  Estimated Onset Date:   Unspecified ; Reactions:   Rash ; Created By:   Kyung Rudd, RN, STEPHANIE L; Reaction Status:   Active ; Category:   Drug ; Substance:   penicillins ; Type:   Allergy ; Severity:   Unknown ; Updated By:   Kyung Rudd RN, Barkley Bruns; Reviewed Date:   02/23/2021 11:58 EDT      sulfa drugs  Estimated Onset Date:   Unspecified ; Reactions:   Rash ; Created By:   Kyung Rudd, RN, STEPHANIE L; Reaction Status:   Active ; Category:   Drug ; Substance:   sulfa drugs ; Type:   Allergy ; Severity:   Unknown ; Updated By:   Kyung Rudd RN, Barkley Bruns; Reviewed Date:   02/23/2021 11:58 EDT        Problem History   (As Of: 02/23/2021 12:02:08 EDT)   Problems(Active)    Acute lower UTI (urinary tract infection) (SNOMED CT  :[id number] )  Name of Problem:   Acute lower UTI (urinary tract infection) ; Recorder:   LUTZ, RN, CARLA W; Confirmation:   Confirmed ; Classification:   Patient Stated ; Code:   9B2WU1L2-GMW1-02V2-5D6U-4403KVQ2595G ; Contributor System:   PowerChart ; Last Updated:   03/04/2016 15:12 EDT ; Life Cycle Date:   03/04/2016 ; Life Cycle Status:   Active ; Vocabulary:   SNOMED  CT        Afib (IMO  :[phone] )  Name of Problem:   Afib ; Recorder:   TAYLOR, RN, JENNIFER S; Confirmation:   Confirmed ; Classification:   Medical ; Code:   [phone] ; Contributor System:   Dietitian ; Last Updated:   02/27/2016 11:48 EDT ; Life Cycle Date:   02/27/2016 ; Life Cycle Status:   Active ; Vocabulary:   IMO        Anxiety (SNOMED CT  :8841660630 )  Name of Problem:   Anxiety ; Recorder:   LUTZ, RN, CARLA W; Confirmation:   Confirmed ; Classification:   Patient Stated ; Code:   [id number] ; Contributor System:   Dietitian ; Last Updated:   03/04/2016 15:13 EDT ; Life Cycle Date:   03/04/2016 ; Life Cycle Status:   Active ; Vocabulary:   SNOMED CT        Apnea, sleep (SNOMED CT  :[id number] )  Name of Problem:   Apnea, sleep ; Recorder:   LUTZ, Charity fundraiser, [employer]  W; Confirmation:   Confirmed ; Classification:   Patient Stated ; Code:   B2W4XL24-4010-2V25-3664-[id number] ; Contributor System:   Dietitian ; Last Updated:   03/04/2016 15:11 EDT ; Life Cycle Date:   03/04/2016 ; Life Cycle Status:   Active ; Vocabulary:   SNOMED CT        Arthritis (SNOMED CT  :8756433 )  Name of Problem:   Arthritis ; Recorder:   LUTZ, RN, CARLA W; Confirmation:   Confirmed ; Classification:   Patient Stated ; Code:   2951884 ; Contributor System:   PowerChart ; Last Updated:   03/04/2016 15:12 EDT ; Life Cycle Date:   03/04/2016 ; Life Cycle Status:   Active ; Vocabulary:   SNOMED CT        Cold sores (SNOMED CT  :1660630 )  Name of Problem:   Cold sores ; Recorder:   LUTZ, RN, CARLA W; Confirmation:   Confirmed ; Classification:   Patient Stated ; Code:   [phone] ; Contributor System:   Dietitian ; Last Updated:   03/04/2016 15:13 EDT ; Life Cycle Date:   03/04/2016 ; Life Cycle Status:   Active ; Vocabulary:   SNOMED CT        Hypercholesterolemia (SNOMED CT  :23557322 )  Name of Problem:   Hypercholesterolemia ; Recorder:   Kyung Rudd, RN, STEPHANIE L; Confirmation:   Confirmed ; Classification:   Patient Stated  ; Code:   [phone] ; Contributor System:   Dietitian ; Last Updated:   05/20/2019 12:21 EDT ; Life Cycle Date:   05/20/2019 ; Life Cycle Status:   Active ; Vocabulary:   SNOMED CT        Hypertension (SNOMED CT  :2376283151 )  Name of Problem:   Hypertension ; Recorder:   LUTZ, RN, CARLA W; Confirmation:   Confirmed ; Classification:   Patient Stated ; Code:   [phone] ; Contributor System:   Dietitian ; Last Updated:   03/04/2016 15:11 EDT ; Life Cycle Date:   03/04/2016 ; Life Cycle Status:   Active ; Vocabulary:   SNOMED CT        Low back pain (SNOMED CT  :626948546 )  Name of Problem:   Low back pain ; Recorder:   HUNTER, RN, DEBORAH L; Confirmation:   Confirmed ; Classification:   Patient Stated ; Code:   [id number] ; Contributor System:   PowerChart ; Last Updated:   03/28/2016 8:49 EDT ; Life Cycle Date:   03/28/2016 ; Life Cycle Status:   Active ; Vocabulary:   SNOMED CT        Lumbar compression fracture (SNOMED CT  [phone] )  Name of Problem:   Lumbar compression fracture ; Recorder:   LUTZ, RN, CARLA W; Confirmation:   Confirmed ; Classification:   Patient Stated ; Code:   [phone] ; Contributor System:   Dietitian ; Last Updated:   03/04/2016 15:10 EDT ; Life Cycle Date:   03/04/2016 ; Life Cycle Status:   Active ; Vocabulary:   SNOMED CT        Lumbar radicular pain (SNOMED CT  :58099833 )  Name of Problem:   Lumbar radicular pain ; Recorder:   Maximino Greenland JR-MD; Confirmation:   Confirmed ; Classification:   Patient Stated ; Code:   82505397 ; Contributor System:   PowerChart ; Last Updated:   03/28/2016 9:17 EDT ; Life Cycle Date:   03/28/2016 ; Life Cycle Status:   Active ; Responsible  Provider:   Maximino Greenland JR-MD; Vocabulary:   SNOMED CT        PE (pulmonary thromboembolism) (IMO  :557322 )  Name of Problem:   PE (pulmonary thromboembolism) ; Recorder:   Ladona Ridgel, RN, JENNIFER S; Confirmation:   Confirmed ; Classification:   Medical ; Code:    [phone] ; Contributor System:   PowerChart ; Last Updated:   02/27/2016 11:47 EDT ; Life Cycle Date:   02/27/2016 ; Life Cycle Status:   Active ; Vocabulary:   IMO        Shortness of breath (IMO  :06237 )  Name of Problem:   Shortness of breath ; Recorder:   SYSTEM,  SYSTEM; Confirmation:   Confirmed ; Classification:   Patient Stated ; Code:   62831 ; Last Updated:   01/15/2021 9:46 EST ; Life Cycle Date:   01/15/2021 ; Life Cycle Status:   Active ; Vocabulary:   IMO          Procedure History        -    Procedure History   (As Of: 02/23/2021 12:02:08 EDT)     Anesthesia Minutes:   0 ; Procedure Name:   Hx of hysterectomy ; Procedure Minutes:   0            Anesthesia Minutes:   0 ; Procedure Name:   Breast reduction ; Procedure Minutes:   0            Anesthesia Minutes:   0 ; Procedure Name:   History of tonsillectomy ; Procedure Minutes:   0            Anesthesia Minutes:   0 ; Procedure Name:   Knee replacement ; Procedure Minutes:   0 ; Comments:     03/07/2016 10:07 EDT - Alben Spittle, RN, PATRICIA E  bilateral            Procedure Dt/Tm:   03/07/2016 12:30:00 EDT ; Location:   [facility] ; Provider:   Marzella Schlein; Anesthesia Type:   General ; :   FERLA-MD,  BRIAN P; Anesthesia Minutes:   0 ; Procedure Name:   Kyphoplasty ; Procedure Minutes:   27 ; Comments:     03/07/2016 13:04 EDT - Aileen Pilot, RN, Eric  auto-populated from documented surgical case ; Clinical Service:   Surgery            Procedure Dt/Tm:   03/28/2016 09:23:00 EDT ; Location:   [facility] ; Provider:   Gardiner Rhyme; Anesthesia Type:   Local ; Anesthesia Minutes:   5 ; Procedure Name:   Lumbar Epidural Steroid Injection ; Procedure Minutes:   5 ; Comments:     03/28/2016 9:28 EDT - Alphonzo Lemmings, RN, ANN D  auto-populated from documented surgical case ; Clinical Service:   Surgery            Procedure Dt/Tm:   04/18/2016 11:45:00 EDT ; Location:   [facility] ; Provider:   Gardiner Rhyme; Anesthesia Type:   Local ; Anesthesia  Minutes:   3 ; Procedure Name:   Lumbar Epidural Steroid Injection ; Procedure Minutes:   3 ; Comments:     04/18/2016 11:56 EDT - MCVICKER, RN, VERONICA  auto-populated from documented surgical case ; Clinical Service:   Surgery            Anesthesia Minutes:   0 ; Procedure Name:   Cholecystectomy ;  Procedure Minutes:   0            Procedure Dt/Tm:   05/06/2019 ; Anesthesia Minutes:   0 ; Procedure Name:   Cardioversion ; Procedure Minutes:   0            Procedure Dt/Tm:   05/20/2019 ; Provider:   Eulah PontSTEEN-MD,  MATTHEW B; Anesthesia Minutes:   0 ; Procedure Name:   Cardioversion ; Procedure Minutes:   0            Procedure Dt/Tm:   02/23/2021 ; Anesthesia Minutes:   0 ; Procedure Name:   TEE ; Procedure Minutes:   0 ; Last Reviewed Dt/Tm:   02/23/2021 12:01:21 EDT            ID Risk Screen Symptoms   Recent Travel History :   No recent travel   TB Symptom Screen :   No symptoms   Last 90 days COVID-19 ID :   No   Close Contact with COVID-19 ID :   No   Last 14 days COVID-19 ID :   No   C. diff Symptom/History ID :   Neither of the above   SCHOFIELD, RN, MELLA O - 02/23/2021 11:58 EDT   Bloodless Medicine   Is Blood Transfusion Acceptable to Patient :   Yes   SCHOFIELD, RN, MELLA O - 02/23/2021 11:58 EDT   Nutrition   MST Does Your Current Diet Include :   None   Nutrition Screen for Malnutrition :   Patient denies   SCHOFIELD, RN, MELLA O - 02/23/2021 11:58 EDT   Functional   Sensory Deficits :   None, Other: glasses   ADLs Prior to Admission :   Independent   SCHOFIELD, RN, MELLA O - 02/23/2021 11:58 EDT   Social History   Social History   (As Of: 02/23/2021 12:02:08 EDT)   Tobacco:        Never smoker   (Last Updated: 02/27/2016 11:55:25 EDT by Ladona RidgelAYLOR, RN, JENNIFER S)          Alcohol:        Current, Wine, 1-2 times per month   (Last Updated: 05/06/2019 10:32:07 EDT by Julio AlmEEN, RN, Durward ParcelUTHANN R)          Substance Use:        Denies   (Last Updated: 02/27/2016 11:55:43 EDT by Ladona RidgelAYLOR, RN, JENNIFER S)            Spiritual   Do you  have a concern that you would like to address with a Chaplain? :   No   Do you have any religious/spiritual/cultural beliefs that could impact the way your care is provided? :   No   SCHOFIELD, RN, MELLA O - 02/23/2021 11:58 EDT   Harm Screen   Suspect or Concern for: :   None   Feels Safe Where Live :   Yes   Last 3 mo, thoughts killing self/others :   Patient denies   SCHOFIELD, RN, MELLA O - 02/23/2021 11:58 EDT   Advance Directive   Location of Advance Directive :   Unable to obtain copy   Advance Directive :   Yes   Type of Advance Directive :   Living will, Medical durable power of attorney   Intent of Advance Directive Stated By :   Self   Patient Wishes to Receive Further Information on Advance Directives :   No   SCHOFIELD, RN, MELLA O -  02/23/2021 11:58 EDT   Education   Written Language :   [place]   Primary Language :   Damita Lack, RN, MELLA O - 02/23/2021 11:58 EDT   Caregiver/Advocate Language   Patient :   Demonstration, Verbal explanation   Cephus Slater, RN, MELLA O - 02/23/2021 11:58 EDT   Barriers to Learning :   None evident   Teaching Method :   Demonstration, Explanation, Printed materials   Responsible Learner Present for Session :   No   Cephus Slater RN, MELLA O - 02/23/2021 11:58 EDT   Preventative Measures Information   Unit/Room Orientation :   Verbalizes understanding   Environmental Safety :   Verbalizes understanding   Hand Washing :   Verbalizes understanding   Infection Prevention :   Verbalizes understanding   SCHOFIELD, RN, MELLA O - 02/23/2021 11:58 EDT   DC Needs   CM Living Situation :   Home with no services   Home Caregiver Name/Relationship :   Spouse(gary)   Current Living Situation :   [phone]   Anticipated Discharge Needs :   None   SCHOFIELD, RN, MELLA O - 02/23/2021 11:58 EDT   Valuables and Belongings   Does Patient Have Valuables and Belongings :   Yes   SCHOFIELD, RN, MELLA O - 02/23/2021 11:58 EDT   Valuables and Belongings   At Bedside :   Clothes, Jewelry, Glasses,  Cell phone   Cephus Slater RN, MELLA O - 02/23/2021 11:58 EDT   Patient Search Completed :   NA   SCHOFIELD, RN, MELLA O - 02/23/2021 11:58 EDT   Admission Complete   Admission Complete :   Yes   SCHOFIELD, RN, MELLA O - 02/23/2021 11:58 EDT

## 2021-02-23 NOTE — Anesthesia Post-Procedure Evaluation (Signed)
Postanesthesia Evaluation        Patient:   Cindy Buck, Cindy Buck             MRN: [id number]            FIN: [id number]               Age:   70 years     Sex:  Female     DOB:  1951/07/25   Associated Diagnoses:   None   Author:   Enedelia Martorelli,  Hye Trawick-MD      Assessment   Postanesthesia assessment   Vitals: Vital Signs   02/23/2021 13:33 EDT Systolic Blood Pressure 123 mmHg    Diastolic Blood Pressure 95 mmHg  HI    Peripheral Pulse Rate 71 bpm    Heart Rate Monitored 69 bpm    Respiratory Rate 19 br/min    Mean Arterial Pressure, Cuff 100 mmHg    SpO2 100 %   02/23/2021 13:29 EDT Systolic Blood Pressure 137 mmHg    Diastolic Blood Pressure 63 mmHg    Peripheral Pulse Rate 69 bpm    Heart Rate Monitored 69 bpm    Respiratory Rate 27 br/min  HI    Mean Arterial Pressure, Cuff 87 mmHg    SpO2 99 %   02/23/2021 11:45 EDT Systolic Blood Pressure 146 mmHg  HI    Diastolic Blood Pressure 75 mmHg    Temperature Oral 36.6 degC    Heart Rate Monitored 65 bpm    Respiratory Rate 18 br/min    SpO2 100 %      , Measurements from flowsheet : Measurements   02/23/2021 11:27 EDT Body Mass Index est meas 32.49 kg/m2    Body Mass Index Measured 32.49 kg/m2   02/23/2021 11:27 EDT Height/Length Measured 162.6 cm    Patient Stated Height/Length 162.6 cm    Weight Measured 85.9 kg    Weight Dosing 85.9 kg      .     Respiratory function: Respiratory rate, airway, and oxygen saturation are at adequate levels.     Cardiovascular function: Heart Rate stable, Blood Pressure stable, Postoperative hydration status Adequate.     Mental status: appropriate for level of anesthesia.     Temperature: within normal limits.     Pain Control: Adequate.     Nausea/Vomiting: Absent.     Signature Line     Electronically Signed on 02/23/2021 01:34 PM EDT   ________________________________________________   Peter Minium

## 2021-02-23 NOTE — Nursing Note (Signed)
Nursing Discharge Summary - Text       Nursing Discharge Summary Entered On:  02/23/2021 14:16 EDT    Performed On:  02/23/2021 14:10 EDT by Cephus Slater, RN, MELLA O               DC Information   Discharge To :   Home independently   Mode of Discharge :   Wheelchair   Transportation :   Private vehicle   Accompanied By :   Corena Pilgrim, RN, MELLA O - 02/23/2021 14:10 EDT   Education   Responsible Learner(s) :   Home Caregiver Name/Relationship: Spouse(gary)        Performed by: Cephus Slater RN, MELLA O - 02/23/2021 11:58  Home Caregiver Phone Number: [phone]        Performed by: Gayla Doss, MELLA O - 02/23/2021 11:58     Home Caregiver Present for Session :   Yes   Barriers To Learning :   None evident   Teaching Method :   Demonstration, Explanation, Printed materials, Teach-back   SCHOFIELD, RN, MELLA O - 02/23/2021 14:10 EDT   Post-Hospital Education Adult Grid   Activity Expectations :   Verbalizes understanding   Importance of Follow-Up Visits :   Verbalizes understanding   Pain Management :   Verbalizes understanding   Physical Limitations :   Verbalizes understanding   Postoperative Instructions :   Verbalizes understanding   When to Call Health Care Provider :   [facility] understanding   Cephus Slater, RN, Morrison Old - 02/23/2021 14:10 EDT   Health Maintenance Education Adult Grid   Bathing/Hygiene :   Verbalizes understanding   Diet/Nutrition :   Verbalizes understanding   SCHOFIELD, RN, [employer] O - 02/23/2021 14:10 EDT   Medication Education Adult Grid   Med Dosage, Route, Scheduling :   Verbalizes understanding   SCHOFIELD, RN, MELLA O - 02/23/2021 14:10 EDT   Additional Learner(s) Present :   Spouse   Time Spent Educating Patient :   15 minutes   SCHOFIELD, RN, MELLA O - 02/23/2021 14:10 EDT

## 2021-08-04 ENCOUNTER — Ambulatory Visit: Admit: 2021-08-04 | Discharge: 2021-08-04 | Payer: MEDICARE | Attending: Family | Primary: Sports Medicine

## 2021-08-04 DIAGNOSIS — R059 Cough, unspecified: Secondary | ICD-10-CM

## 2021-08-04 LAB — POC COVID-19 & INFLUENZA COMBO (LIAT IN HOUSE)
INFLUENZA A: NOT DETECTED
INFLUENZA B: NOT DETECTED
SARS-CoV-2: DETECTED — AB

## 2021-08-04 NOTE — Progress Notes (Signed)
Subjective:      Patient ID: Cindy Buck     HPI The patient is a 70 y.o. female [facility] for [facility] of nasal congestion, cough, and sore throat that started yesterday. Associated w/ Tmax of 98.8 however she typically runs in the 96 range. She denies any SOB, CP, chills or body aches. Took a NyQuil with some relief. Because of the inclement weather, patient opted for more urgent evaluation and Covid testing.    Review of Systems: Otherwise negative and as noted in the HPI    Past Medical History:   Diagnosis Date    Atrial fibrillation (HCC)     Hyperlipidemia     Hypertension         Past Surgical History:   Procedure Laterality Date    CARDIAC ELECTROPHYSIOLOGY MAPPING AND ABLATION  06/29/2021        Allergies   Allergen Reactions    Penicillin G      Other reaction(s): Hives, BREATHING ISSUES    Sulfa Antibiotics      Other reaction(s): Hives, BREATHING ISSUES       Current Outpatient Medications   Medication Sig Dispense Refill    acetaminophen (TYLENOL) 325 MG tablet 1 tablet as needed Orally every 4 hrs      apixaban (ELIQUIS) 5 MG TABS tablet Orally 2 times a day      busPIRone (BUSPAR) 5 MG tablet 1 tablet Orally Twice a day      cetirizine (ZYRTEC) 10 MG tablet 1 tablet Orally Once a day      flecainide (TAMBOCOR) 50 MG tablet as directed Orally      fluticasone (FLONASE) 50 MCG/ACT nasal spray 1 spray in each nostril Nasally Once a day      furosemide (LASIX) 20 MG tablet 1 tablet Orally Once a day      irbesartan (AVAPRO) 75 MG tablet 1 tablet Orally Once a day for 30 day(s)      magnesium oxide (MAG-OX) 400 MG tablet 1 tablet as needed Orally Once a day for 30 day(s)      metoprolol succinate (TOPROL XL) 25 MG extended release tablet 1 tablet Orally Once a day      potassium chloride (MICRO-K) 10 MEQ extended release capsule 1 capsule with food Orally Twice a day for 30 day(s)      rosuvastatin (CRESTOR) 10 MG tablet 1 tablet Orally Once a day      valACYclovir (VALTREX) 1 g tablet  1 tablet Orally every 24 hrs      zolpidem (AMBIEN) 5 MG tablet 1 tablet at bedtime Orally Once a day      Azilsartan Medoxomil (EDARBI) 40 MG TABS 1 tablet Orally Once a day (Patient not taking: Reported on 08/04/2021)      HYDROcodone-acetaminophen (NORCO) 5-325 MG per tablet 1 tablet as needed Orally every 6 hrs (Patient not taking: Reported on 08/04/2021)      LORazepam (ATIVAN) 1 MG tablet 1 tablet at bedtime as needed Orally Once a day (Patient not taking: Reported on 08/04/2021)      methylPREDNISolone (MEDROL DOSEPACK) 4 MG tablet Orally (Patient not taking: Reported on 08/04/2021)      nitrofurantoin, macrocrystal-monohydrate, (MACROBID) 100 MG capsule 1 capsule with food Orally every 12 hrs FOR UNCOMPLICATED UTI for 5 days (Patient not taking: Reported on 08/04/2021)      omeprazole (PRILOSEC) 40 MG delayed release capsule 1 capsule (Patient not taking: Reported on 08/04/2021)  ondansetron (ZOFRAN) 4 MG tablet 1 tablet Orally every 4 hours as needed for nausea (Patient not taking: Reported on 08/04/2021)      phenazopyridine (PYRIDIUM) 200 MG tablet 1 tablet after meals Orally Three times a day AS NEEDED FOR BLADDER SPASMS for 2 day(s) (Patient not taking: Reported on 08/04/2021)       No current facility-administered medications for this visit.        BP (!) 150/82    Pulse 90    Temp 98.2 ??F (36.8 ??C)    Resp 18    SpO2 98%      Objective:   Physical Exam  Constitutional:       Appearance: Normal appearance.   HENT:      Head: Normocephalic and atraumatic.      Right Ear: Tympanic membrane and ear canal normal.      Left Ear: Tympanic membrane and ear canal normal.      Nose:      Right Sinus: No maxillary sinus tenderness.      Left Sinus: No maxillary sinus tenderness.      Mouth/Throat:      Mouth: Mucous membranes are moist.      Pharynx: Posterior oropharyngeal erythema present.   Eyes:      Conjunctiva/sclera: Conjunctivae normal.   Cardiovascular:      Rate and Rhythm: Normal rate and regular rhythm.       Heart sounds: Normal heart sounds.   Pulmonary:      Effort: Pulmonary effort is normal.      Breath sounds: Normal breath sounds.   Skin:     General: Skin is warm and dry.   Neurological:      Mental Status: She is alert and oriented to person, place, and time.   Psychiatric:         Mood and Affect: Mood normal.         Behavior: Behavior normal.         Thought Content: Thought content normal.         Judgment: Judgment normal.       @SCANIMAGES @    Assessment:       ICD-10-CM    1. Cough  R05.9 COVID-19 & Influenza Combo ([facility])      2. COVID-19  U07.1            Plan:   No orders of the defined types were placed in this encounter.      Results for orders placed or performed in visit on 08/04/21   COVID-19 & Influenza Combo ([facility])    Specimen: Nasal Swab   Result Value Ref Range    SARS-CoV-2 Detected (A) Not Detected    INFLUENZA A Not Detected Not Detected    INFLUENZA B Not Detected Not Detected          Medication List            Accurate as of August 04, 2021  2:29 PM. If you have any questions, ask your nurse or doctor.                CONTINUE taking these medications      acetaminophen 325 MG tablet  Commonly known as: TYLENOL     apixaban 5 MG Tabs tablet  Commonly known as: ELIQUIS     busPIRone 5 MG tablet  Commonly known as: BUSPAR     cetirizine 10 MG tablet  Commonly known as: ZYRTEC  Edarbi 40 MG Tabs  Generic drug: Azilsartan Medoxomil     flecainide 50 MG tablet  Commonly known as: TAMBOCOR     fluticasone 50 MCG/ACT nasal spray  Commonly known as: FLONASE     furosemide 20 MG tablet  Commonly known as: LASIX     HYDROcodone-acetaminophen 5-325 MG per tablet  Commonly known as: NORCO     irbesartan 75 MG tablet  Commonly known as: AVAPRO     LORazepam 1 MG tablet  Commonly known as: ATIVAN     magnesium oxide 400 MG tablet  Commonly known as: MAG-OX     methylPREDNISolone 4 MG tablet  Commonly known as: MEDROL DOSEPACK     metoprolol succinate 25 MG extended release  tablet  Commonly known as: TOPROL XL     nitrofurantoin (macrocrystal-monohydrate) 100 MG capsule  Commonly known as: MACROBID     omeprazole 40 MG delayed release capsule  Commonly known as: PRILOSEC     ondansetron 4 MG tablet  Commonly known as: ZOFRAN     phenazopyridine 200 MG tablet  Commonly known as: PYRIDIUM     potassium chloride 10 MEQ extended release capsule  Commonly known as: MICRO-K     rosuvastatin 10 MG tablet  Commonly known as: CRESTOR     valACYclovir 1 g tablet  Commonly known as: VALTREX     zolpidem 5 MG tablet  Commonly known as: AMBIEN                 Patient is + for Covid 19. Discussed supportive care, contagiousness, and indications for f/u. We also reviewed treatment options however limited due to anticoagulants. May consider infusion through [facility] pending progression. F/u precautions reviewed. Patient voiced understanding, no questions at this time.        Leafy Ro, APRN - NP

## 2021-08-05 ENCOUNTER — Ambulatory Visit: Admit: 2021-08-05 | Discharge: 2021-08-05 | Payer: MEDICARE | Attending: Family | Primary: Sports Medicine

## 2021-08-05 DIAGNOSIS — N390 Urinary tract infection, site not specified: Secondary | ICD-10-CM

## 2021-08-05 LAB — AMB POC URINALYSIS DIP STICK AUTO W/O MICRO
Bilirubin, Urine, POC: NEGATIVE
Glucose, Urine, POC: NEGATIVE
Ketones, Urine, POC: NEGATIVE
Nitrite, Urine, POC: POSITIVE
Protein, Urine, POC: 100
Specific Gravity, Urine, POC: 1.015 (ref 1.001–1.035)
Urobilinogen, POC: 0.2
pH, Urine, POC: 6.5 (ref 4.6–8.0)

## 2021-08-05 MED ORDER — NITROFURANTOIN MONOHYD MACRO 100 MG PO CAPS
100 MG | ORAL_CAPSULE | Freq: Two times a day (BID) | ORAL | 0 refills | Status: AC
Start: 2021-08-05 — End: 2021-08-10

## 2021-08-05 NOTE — Progress Notes (Signed)
Subjective:      Patient ID: Cindy Buck       Urinary Tract Infection   The patient is a 70 y.o. female, who tested positive for COVID yesterday, presents with complaints of dysuria and urinary frequency.  Patient states that she believes that the symptoms have been brewing for several days, but states that they got significantly worse last night.  Patient has been running a fever.  She denies abdominal pain, back pain, nausea, and vomiting.  No home remedies attempted.    Review of Systems: As noted in the HPI    Past Medical History:   Diagnosis Date    Atrial fibrillation (HCC)     Hyperlipidemia     Hypertension         Past Surgical History:   Procedure Laterality Date    CARDIAC ELECTROPHYSIOLOGY MAPPING AND ABLATION  06/29/2021        Allergies   Allergen Reactions    Penicillin G      Other reaction(s): Hives, BREATHING ISSUES    Sulfa Antibiotics      Other reaction(s): Hives, BREATHING ISSUES        Current Outpatient Medications   Medication Sig Dispense Refill    nitrofurantoin, macrocrystal-monohydrate, (MACROBID) 100 MG capsule Take 1 capsule by mouth 2 times daily for 5 days 10 capsule 0    acetaminophen (TYLENOL) 325 MG tablet 1 tablet as needed Orally every 4 hrs      apixaban (ELIQUIS) 5 MG TABS tablet Orally 2 times a day      Azilsartan Medoxomil (EDARBI) 40 MG TABS       busPIRone (BUSPAR) 5 MG tablet 1 tablet Orally Twice a day      cetirizine (ZYRTEC) 10 MG tablet 1 tablet Orally Once a day      fluticasone (FLONASE) 50 MCG/ACT nasal spray 1 spray in each nostril Nasally Once a day      furosemide (LASIX) 20 MG tablet as needed      irbesartan (AVAPRO) 75 MG tablet 1 tablet Orally Once a day for 30 day(s)      magnesium oxide (MAG-OX) 400 MG tablet 1 tablet as needed Orally Once a day for 30 day(s)      methylPREDNISolone (MEDROL DOSEPACK) 4 MG tablet       metoprolol succinate (TOPROL XL) 25 MG extended release tablet 1 tablet Orally Once a day      potassium chloride (MICRO-K) 10 MEQ  extended release capsule 1 capsule with food Orally Twice a day for 30 day(s)      rosuvastatin (CRESTOR) 10 MG tablet 1 tablet Orally Once a day      valACYclovir (VALTREX) 1 g tablet 1 tablet Orally every 24 hrs      zolpidem (AMBIEN) 5 MG tablet 1 tablet at bedtime Orally Once a day      flecainide (TAMBOCOR) 50 MG tablet as directed Orally (Patient not taking: Reported on 08/05/2021)      HYDROcodone-acetaminophen (NORCO) 5-325 MG per tablet 1 tablet as needed Orally every 6 hrs (Patient not taking: No sig reported)      LORazepam (ATIVAN) 1 MG tablet 1 tablet at bedtime as needed Orally Once a day (Patient not taking: No sig reported)      nitrofurantoin, macrocrystal-monohydrate, (MACROBID) 100 MG capsule 1 capsule with food Orally every 12 hrs FOR UNCOMPLICATED UTI for 5 days (Patient not taking: No sig reported)      omeprazole (PRILOSEC) 40 MG delayed release  capsule 1 capsule (Patient not taking: No sig reported)      ondansetron (ZOFRAN) 4 MG tablet 1 tablet Orally every 4 hours as needed for nausea (Patient not taking: No sig reported)      phenazopyridine (PYRIDIUM) 200 MG tablet 1 tablet after meals Orally Three times a day AS NEEDED FOR BLADDER SPASMS for 2 day(s) (Patient not taking: No sig reported)       No current facility-administered medications for this visit.        BP (!) 155/80    Pulse 82    Temp 97.6 ??F (36.4 ??C)    Resp 18    SpO2 98%      Objective:   Physical Exam  Vitals and nursing note reviewed.   Constitutional:       General: She is awake. She is not in acute distress.     Appearance: Normal appearance. She is well-developed and well-groomed. She is not toxic-appearing.   HENT:      Head: Normocephalic and atraumatic.   Eyes:      Extraocular Movements: Extraocular movements intact.      Conjunctiva/sclera: Conjunctivae normal.   Cardiovascular:      Rate and Rhythm: Normal rate and regular rhythm.      Pulses: Normal pulses.           Radial pulses are 2+ on the right side and 2+  on the left side.      Heart sounds: Normal heart sounds. No murmur heard.    No friction rub. No gallop.   Pulmonary:      Effort: Pulmonary effort is normal. No respiratory distress.      Breath sounds: Normal breath sounds. No stridor. No wheezing, rhonchi or rales.   Musculoskeletal:         General: Normal range of motion.      Cervical back: Normal range of motion and neck supple.   Skin:     General: Skin is warm and dry.   Neurological:      General: No focal deficit present.      Mental Status: She is alert and oriented to person, place, and time.   Psychiatric:         Mood and Affect: Mood normal.         Behavior: Behavior normal. Behavior is cooperative.         Thought Content: Thought content normal.         Judgment: Judgment normal.       No scans are attached to the encounter.     Assessment:       ICD-10-CM    1. Acute urinary tract infection  N39.0 nitrofurantoin, macrocrystal-monohydrate, (MACROBID) 100 MG capsule      2. Dysuria  R30.0 AMB POC URINALYSIS DIP STICK AUTO W/O MICRO     CANCELED: Culture, Urine           Plan:     Results for orders placed or performed in visit on 08/05/21   AMB POC URINALYSIS DIP STICK AUTO W/O MICRO   Result Value Ref Range    Color (UA POC) Yellow     Clarity (UA POC) Cloudy     Glucose, Urine, POC Negative Negative    Bilirubin, Urine, POC Negative Negative    Ketones, Urine, POC Negative Negative    Specific Gravity, Urine, POC 1.015 1.001 - 1.035    Blood (UA POC) Moderate Negative    pH, Urine, POC 6.5  4.6 - 8.0    Protein, Urine, POC 100 Negative    Urobilinogen, POC 0.2 mg/dL     Nitrite, Urine, POC Positive Negative    Leukocyte Esterase, Urine, POC Large Negative      Urinalysis is indicative of a urinary tract infection.  Unfortunately, the patient was not able to provide enough of the specimen for urine culture.  The patient is allergic to sulfa and penicillin.  States that she had a urinary tract infection 07/16/2021 which was treated with cephalexin.   The patient did have a recent BMP in the system from March 2022.  Review of this lab work revealed normal kidney function.  We will start the patient on nitrofurantoin.  Instructed patient on how to administer and possible side effects.  Discussed the importance of maintaining adequate hydration.  Advised the patient to follow-up immediately if her symptoms did not improve after the next 2 to 3 days of antibiotic use or for any new or worsening symptoms.  The patient verbalized understanding and agreement with plan of care.         Vernelle Emerald, APRN - NP

## 2021-08-27 ENCOUNTER — Ambulatory Visit: Admit: 2021-08-27 | Discharge: 2021-08-27 | Payer: MEDICARE | Attending: Family | Primary: Sports Medicine

## 2021-08-27 ENCOUNTER — Encounter

## 2021-08-27 DIAGNOSIS — R339 Retention of urine, unspecified: Secondary | ICD-10-CM

## 2021-08-27 LAB — AMB POC URINALYSIS DIP STICK AUTO W/O MICRO
Bilirubin, Urine, POC: NEGATIVE
Blood (UA POC): NEGATIVE
Glucose, Urine, POC: NEGATIVE
Ketones, Urine, POC: NEGATIVE
Leukocyte Esterase, Urine, POC: NEGATIVE
Nitrite, Urine, POC: POSITIVE
Protein, Urine, POC: NEGATIVE
Specific Gravity, Urine, POC: 1.02 (ref 1.001–1.035)
Urobilinogen, POC: 0.2
pH, Urine, POC: 7 (ref 4.6–8.0)

## 2021-08-27 MED ORDER — TRAMADOL HCL 50 MG PO TABS
50 MG | ORAL_TABLET | Freq: Four times a day (QID) | ORAL | 0 refills | Status: AC | PRN
Start: 2021-08-27 — End: 2021-09-01

## 2021-08-27 MED ORDER — CEPHALEXIN 500 MG PO CAPS
500 MG | ORAL_CAPSULE | Freq: Two times a day (BID) | ORAL | 0 refills | Status: AC
Start: 2021-08-27 — End: 2021-09-03

## 2021-08-27 NOTE — Progress Notes (Signed)
Subjective:      Patient ID: Cindy Buck       Foot Pain    The patient is a 70 y.o. female presents to express care with spouse for evaluation of 2 separate issues.  Left foot pain with associated redness, swelling and warmth that started 3 days ago.  She did have what she says was a foot cramp and leg cramps on that side but after that time it seemed to get really red and inflamed.  She denies any acute injury or incidents where she could have been bitten by something.  She has no fevers.  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  She was on Eliquis and recently switched to Xarelto and has been compliant with this medicine.  She has no known history of gout.  UTI symptoms that started about a week ago.  She has chronic UTIs and is followed by urology however [place].  It was advised that she get a new culture.  Last time was about a month ago and she was unable to produce enough urine to culture it.  She was treated successfully with Macrobid.  Prior to that she was on Keflex.  Her last culture in March showed E. coli with good sensitivities.  She admits to urinary hesitancy, dysuria, frequency.  She denies any hematuria or flank pain.    Review of Systems: Otherwise negative and as noted in the HPI    Past Medical History:   Diagnosis Date   ??? Atrial fibrillation (HCC)    ??? Hyperlipidemia    ??? Hypertension         Past Surgical History:   Procedure Laterality Date   ??? CARDIAC ELECTROPHYSIOLOGY MAPPING AND ABLATION  06/29/2021        Allergies   Allergen Reactions   ??? Penicillin G      Other reaction(s): Hives, BREATHING ISSUES   ??? Sulfa Antibiotics      Other reaction(s): Hives, BREATHING ISSUES       Current Outpatient Medications   Medication Sig Dispense Refill   ??? Rivaroxaban (XARELTO PO) Take by mouth     ??? cephALEXin (KEFLEX) 500 MG capsule Take 1 capsule by mouth 2 times daily for 7 days 14 capsule 0   ??? traMADol (ULTRAM) 50 MG tablet  Take 1 tablet by mouth every 6 hours as needed for Pain for up to 5 days. Intended supply: 5 days. Take lowest dose possible to manage pain 20 tablet 0   ??? Azilsartan Medoxomil (EDARBI) 40 MG TABS      ??? busPIRone (BUSPAR) 5 MG tablet 1 tablet Orally Twice a day     ??? cetirizine (ZYRTEC) 10 MG tablet 1 tablet Orally Once a day     ??? flecainide (TAMBOCOR) 50 MG tablet      ??? fluticasone (FLONASE) 50 MCG/ACT nasal spray 1 spray in each nostril Nasally Once a day     ??? furosemide (LASIX) 20 MG tablet as needed     ??? HYDROcodone-acetaminophen (NORCO) 5-325 MG per tablet 1 tablet as needed Orally every 6 hrs     ??? irbesartan (AVAPRO) 75 MG tablet 1 tablet Orally Once a day for 30 day(s)     ??? LORazepam (ATIVAN) 1 MG tablet 1 tablet at bedtime as needed Orally Once a day     ??? magnesium oxide (MAG-OX) 400 MG tablet 1 tablet as needed Orally Once  a day for 30 day(s)     ??? metoprolol succinate (TOPROL XL) 25 MG extended release tablet 1 tablet Orally Once a day     ??? potassium chloride (MICRO-K) 10 MEQ extended release capsule 1 capsule with food Orally Twice a day for 30 day(s)     ??? rosuvastatin (CRESTOR) 10 MG tablet 1 tablet Orally Once a day     ??? valACYclovir (VALTREX) 1 g tablet 1 tablet Orally every 24 hrs     ??? zolpidem (AMBIEN) 5 MG tablet 1 tablet at bedtime Orally Once a day     ??? acetaminophen (TYLENOL) 325 MG tablet 1 tablet as needed Orally every 4 hrs (Patient not taking: Reported on 08/27/2021)     ??? apixaban (ELIQUIS) 5 MG TABS tablet Orally 2 times a day (Patient not taking: Reported on 08/27/2021)     ??? methylPREDNISolone (MEDROL DOSEPACK) 4 MG tablet  (Patient not taking: Reported on 08/27/2021)     ??? nitrofurantoin, macrocrystal-monohydrate, (MACROBID) 100 MG capsule 1 capsule with food Orally every 12 hrs FOR UNCOMPLICATED UTI for 5 days (Patient not taking: No sig reported)     ??? omeprazole (PRILOSEC) 40 MG delayed release capsule 1 capsule (Patient not taking: No sig reported)     ??? ondansetron  (ZOFRAN) 4 MG tablet 1 tablet Orally every 4 hours as needed for nausea (Patient not taking: No sig reported)     ??? phenazopyridine (PYRIDIUM) 200 MG tablet 1 tablet after meals Orally Three times a day AS NEEDED FOR BLADDER SPASMS for 2 day(s) (Patient not taking: No sig reported)       No current facility-administered medications for this visit.        BP (!) 160/80    Pulse 74    Temp 98 ??F (36.7 ??C)    Resp 16    SpO2 98%      Objective:   Physical Exam  Constitutional:       Appearance: Normal appearance.   HENT:      Head: Normocephalic and atraumatic.      Mouth/Throat:      Mouth: Mucous membranes are moist.   Pulmonary:      Effort: Pulmonary effort is normal.   Abdominal:      Palpations: Abdomen is soft.      Tenderness: There is no right CVA tenderness or left CVA tenderness.   Musculoskeletal:      Comments: Left foot dorsal surface with redness and warmth mostly over the lateral aspect over the fourth and fifth metatarsals with significant tenderness.  She has full range of motion without any streaking.  She has no calf tenderness or swelling.   Skin:     General: Skin is warm and dry.   Neurological:      Mental Status: She is alert and oriented to person, place, and time.   Psychiatric:         Mood and Affect: Mood normal.         Behavior: Behavior normal.         Thought Content: Thought content normal.         Judgment: Judgment normal.       @SCANIMAGES @    Assessment:   1. Urinary retention  -     AMB POC URINALYSIS DIP STICK AUTO W/O MICRO  -     Culture, Urine  2. Localized swelling of left foot  -     Uric Acid; Future  -  traMADol (ULTRAM) 50 MG tablet; Take 1 tablet by mouth every 6 hours as needed for Pain for up to 5 days. Intended supply: 5 days. Take lowest dose possible to manage pain, Disp-20 tablet, R-0Normal  3. Acute cystitis without hematuria  -     cephALEXin (KEFLEX) 500 MG capsule; Take 1 capsule by mouth 2 times daily for 7 days, Disp-14 capsule, R-0Normal        Plan:      Orders Placed This Encounter   Medications   ??? cephALEXin (KEFLEX) 500 MG capsule     Sig: Take 1 capsule by mouth 2 times daily for 7 days     Dispense:  14 capsule     Refill:  0   ??? traMADol (ULTRAM) 50 MG tablet     Sig: Take 1 tablet by mouth every 6 hours as needed for Pain for up to 5 days. Intended supply: 5 days. Take lowest dose possible to manage pain     Dispense:  20 tablet     Refill:  0     Reduce doses taken as pain becomes manageable         Results for orders placed or performed in visit on 08/27/21   AMB POC URINALYSIS DIP STICK AUTO W/O MICRO   Result Value Ref Range    Color (UA POC) Yellow     Clarity (UA POC) Clear     Glucose, Urine, POC Negative Negative    Bilirubin, Urine, POC Negative Negative    Ketones, Urine, POC Negative Negative    Specific Gravity, Urine, POC 1.020 1.001 - 1.035    Blood (UA POC) Negative Negative    pH, Urine, POC 7.0 4.6 - 8.0    Protein, Urine, POC Negative Negative    Urobilinogen, POC 0.2 mg/dL     Nitrite, Urine, POC Positive Negative    Leukocyte Esterase, Urine, POC Negative Negative                We will start patient on Keflex pending urinalysis.  It is positive for nitrites.  We will also culture and give her a call if treatment needs to be adjusted.  In regards to her foot, we have done a uric acid to rule out gout which I am slightly suspicious for given the sensitivity and acute onset however if this is a cellulitis Keflex should also cover it to help improve her symptoms.  Tramadol for discomfort as needed.  Call patient with test results.        Leafy Ro, APRN - NP

## 2021-08-27 NOTE — Progress Notes (Signed)
Venipuncture: 23 gauge needle used, site used: LAC, site cleansed and pressure applied, patient tolerated procedure well, 1 tube(s) collected

## 2021-08-28 ENCOUNTER — Telehealth

## 2021-08-28 LAB — URIC ACID: Uric Acid: 6.6 mg/dL — ABNORMAL HIGH (ref 2.4–5.7)

## 2021-08-28 MED ORDER — COLCHICINE 0.6 MG PO TABS
0.6 MG | ORAL_TABLET | ORAL | 0 refills | Status: AC
Start: 2021-08-28 — End: 2022-07-09

## 2021-08-28 MED ORDER — METHYLPREDNISOLONE 4 MG PO TBPK
4 MG | PACK | ORAL | 0 refills | Status: AC
Start: 2021-08-28 — End: 2022-07-09

## 2021-08-28 NOTE — Telephone Encounter (Signed)
Uric acid level elevated, pt continues to have significant pain and swelling of her left foot. Will send medrol dose pk and colchicine in for now. Patient to return within 24-48hrs if symptoms have not improved

## 2021-08-29 LAB — CULTURE, URINE: FINAL REPORT: >100000

## 2021-08-30 MED ORDER — NITROFURANTOIN MONOHYD MACRO 100 MG PO CAPS
100 MG | ORAL_CAPSULE | Freq: Two times a day (BID) | ORAL | 0 refills | Status: AC
Start: 2021-08-30 — End: 2021-09-04

## 2021-08-30 NOTE — Other (Signed)
LVM for patient to return call to discuss labs.

## 2021-08-30 NOTE — Telephone Encounter (Signed)
REC'D FAX URINE CULTURE RESULT, SCANNED TO ORDER

## 2022-07-09 ENCOUNTER — Ambulatory Visit: Admit: 2022-07-09 | Discharge: 2022-07-09 | Payer: MEDICARE | Primary: Sports Medicine

## 2022-07-09 DIAGNOSIS — R300 Dysuria: Secondary | ICD-10-CM

## 2022-07-09 LAB — AMB POC URINALYSIS DIP STICK AUTO W/O MICRO
Bilirubin, Urine, POC: NEGATIVE
Glucose, Urine, POC: NEGATIVE
Ketones, Urine, POC: NEGATIVE
Nitrite, Urine, POC: POSITIVE
Protein, Urine, POC: 30
Specific Gravity, Urine, POC: 1.02 (ref 1.001–1.035)
Urobilinogen, POC: 0.2
pH, Urine, POC: 6 (ref 4.6–8.0)

## 2022-07-09 MED ORDER — COLCHICINE 0.6 MG PO TABS
0.6 MG | ORAL_TABLET | ORAL | 0 refills | Status: AC
Start: 2022-07-09 — End: ?

## 2022-07-09 MED ORDER — CEPHALEXIN 500 MG PO CAPS
500 MG | ORAL_CAPSULE | Freq: Two times a day (BID) | ORAL | 0 refills | Status: AC
Start: 2022-07-09 — End: 2022-07-16

## 2022-07-09 MED ORDER — METHYLPREDNISOLONE 4 MG PO TBPK
4 MG | PACK | ORAL | 0 refills | Status: AC
Start: 2022-07-09 — End: ?

## 2022-07-09 NOTE — Progress Notes (Incomplete)
Cindy Buck (DOB:  15-Oct-1951) is a 71 y.o. female, here for evaluation of the following chief complaint(s): Foot Pain (Left foot pain onset 4 days ago, her big toe area is painful and sensative to the touch, same symptoms to her right foot second toe. ) and Urinary Tract Infection (Onset week now, dysuria, frequency urination and right low back pain)           Subjective   SUBJECTIVE/OBJECTIVE:    HPI    Allergies   Allergen Reactions   . Penicillin G      Other reaction(s): Hives, BREATHING ISSUES   . Sulfa Antibiotics      Other reaction(s): Hives, BREATHING ISSUES        Current Outpatient Medications on File Prior to Visit   Medication Sig Dispense Refill   . methylPREDNISolone (MEDROL DOSEPACK) 4 MG tablet Take by mouth. 1 kit 0   . colchicine (COLCRYS) 0.6 MG tablet Take two tablets x1, Then two hours later take one tablet 3 tablet 0   . Rivaroxaban (XARELTO PO) Take by mouth     . acetaminophen (TYLENOL) 325 MG tablet 1 tablet as needed Orally every 4 hrs (Patient not taking: Reported on 08/27/2021)     . apixaban (ELIQUIS) 5 MG TABS tablet Orally 2 times a day (Patient not taking: Reported on 08/27/2021)     . Azilsartan Medoxomil (EDARBI) 40 MG TABS      . busPIRone (BUSPAR) 5 MG tablet 1 tablet Orally Twice a day     . cetirizine (ZYRTEC) 10 MG tablet 1 tablet Orally Once a day     . flecainide (TAMBOCOR) 50 MG tablet      . fluticasone (FLONASE) 50 MCG/ACT nasal spray 1 spray in each nostril Nasally Once a day     . furosemide (LASIX) 20 MG tablet as needed     . HYDROcodone-acetaminophen (NORCO) 5-325 MG per tablet 1 tablet as needed Orally every 6 hrs     . irbesartan (AVAPRO) 75 MG tablet 1 tablet Orally Once a day for 30 day(s)     . LORazepam (ATIVAN) 1 MG tablet 1 tablet at bedtime as needed Orally Once a day     . magnesium oxide (MAG-OX) 400 MG tablet 1 tablet as needed Orally Once a day for 30 day(s)     . methylPREDNISolone (MEDROL DOSEPACK) 4 MG tablet  (Patient not taking: Reported on  08/27/2021)     . metoprolol succinate (TOPROL XL) 25 MG extended release tablet 1 tablet Orally Once a day     . nitrofurantoin, macrocrystal-monohydrate, (MACROBID) 100 MG capsule 1 capsule with food Orally every 12 hrs FOR UNCOMPLICATED UTI for 5 days (Patient not taking: No sig reported)     . omeprazole (PRILOSEC) 40 MG delayed release capsule 1 capsule (Patient not taking: No sig reported)     . ondansetron (ZOFRAN) 4 MG tablet 1 tablet Orally every 4 hours as needed for nausea (Patient not taking: No sig reported)     . phenazopyridine (PYRIDIUM) 200 MG tablet 1 tablet after meals Orally Three times a day AS NEEDED FOR BLADDER SPASMS for 2 day(s) (Patient not taking: No sig reported)     . potassium chloride (MICRO-K) 10 MEQ extended release capsule 1 capsule with food Orally Twice a day for 30 day(s)     . rosuvastatin (CRESTOR) 10 MG tablet 1 tablet Orally Once a day     . valACYclovir (VALTREX) 1 g tablet 1 tablet Orally  every 24 hrs     . zolpidem (AMBIEN) 5 MG tablet 1 tablet at bedtime Orally Once a day       No current facility-administered medications on file prior to visit.            Past Medical History:   Diagnosis Date   . Atrial fibrillation (HCC)    . Hyperlipidemia    . Hypertension         Past Surgical History:   Procedure Laterality Date   . CARDIAC ELECTROPHYSIOLOGY MAPPING AND ABLATION  06/29/2021        Social History     Tobacco Use   . Smoking status: Never   . Smokeless tobacco: Never        Review of Systems  See HPI; otherwise ROS negative.         Objective   BP (!) 163/82 (Site: Left Upper Arm, Position: Sitting)   Pulse 81   Temp 97.7 F (36.5 C) (Oral)   Resp 16   SpO2 100%    Physical Exam  No scans are attached to the encounter.  Results for orders placed or performed in visit on 07/09/22   AMB POC URINALYSIS DIP STICK AUTO W/O MICRO   Result Value Ref Range    Color (UA POC) Yellow     Clarity (UA POC) Clear     Glucose, Urine, POC Negative Negative    Bilirubin, Urine,  POC Negative Negative    Ketones, Urine, POC Negative Negative    Specific Gravity, Urine, POC 1.020 1.001 - 1.035    Blood (UA POC) Trace Negative    pH, Urine, POC 6.0 4.6 - 8.0    Protein, Urine, POC 30 Negative    Urobilinogen, POC 0.2 mg/dL     Nitrite, Urine, POC Positive Negative    Leukocyte Esterase, Urine, POC Small Negative             ASSESSMENT/PLAN:  1. Dysuria  -     AMB POC URINALYSIS DIP STICK AUTO W/O MICRO  -     Culture, Urine      Patient to call our office or return with any questions, concerns or worsening signs or symptoms.    An electronic signature was used to authenticate this note.    --Cathleen Fears, APRN - CNP

## 2022-07-10 LAB — CULTURE, URINE: FINAL REPORT: >100000 — AB

## 2022-07-11 ENCOUNTER — Encounter

## 2022-07-11 MED ORDER — NITROFURANTOIN MONOHYD MACRO 100 MG PO CAPS
100 MG | ORAL_CAPSULE | Freq: Two times a day (BID) | ORAL | 0 refills | Status: AC
Start: 2022-07-11 — End: 2022-07-16

## 2022-07-11 NOTE — Other (Signed)
Patient returned VM, able to go to the pharmacy today to change her medication.

## 2022-07-13 NOTE — Telephone Encounter (Signed)
-----   Message from Earlie Lou, DO sent at 07/11/2022 10:33 AM EDT -----  Patient urine culture is growing ESBL again. It is resistant to Keflex. It it sensitive to [facility] which looks like patient has done before. We will send in new antibiotic to the pharmacy. Left VM with patient to return call.

## 2023-07-24 ENCOUNTER — Ambulatory Visit: Admit: 2023-07-24 | Discharge: 2023-07-24 | Payer: MEDICARE | Primary: Sports Medicine

## 2023-07-24 VITALS — BP 169/82 | HR 77 | Temp 98.00000°F | Resp 18 | Ht 64.0 in | Wt 185.0 lb

## 2023-07-24 DIAGNOSIS — R350 Frequency of micturition: Secondary | ICD-10-CM

## 2023-07-24 LAB — AMB POC URINALYSIS DIP STICK AUTO W/O MICRO
Bilirubin, Urine, POC: NEGATIVE
Glucose, Urine, POC: NEGATIVE
Ketones, Urine, POC: NEGATIVE
Nitrite, Urine, POC: POSITIVE
Protein, Urine, POC: NEGATIVE
Specific Gravity, Urine, POC: 1.005 (ref 1.001–1.035)
Urobilinogen, POC: 0.2
pH, Urine, POC: 6 (ref 4.6–8.0)

## 2023-07-24 MED ORDER — NITROFURANTOIN MONOHYD MACRO 100 MG PO CAPS
100 | ORAL_CAPSULE | Freq: Two times a day (BID) | ORAL | 0 refills | Status: AC
Start: 2023-07-24 — End: 2023-07-31

## 2023-07-24 NOTE — Progress Notes (Signed)
 Cindy Buck (DOB:  1951/03/12) is a 72 y.o. female, here for evaluation of the following chief complaint(s): Urinary Tract Infection (Sx: frequent urination, burning with urination; started 3 days ago; pt did a azo strip and was positive for nitrate and leukocytes; pt took Azo last night )      SUBJECTIVE/OBJECTIVE:    This patient is a 72 year old female presents today for evaluation of dysuria, urinary frequency, hesitancy, burning urination.  Symptom onset 3 days ago.  She is taken Azo intermittently for symptom management.  She denies fevers, chills, body aches.  Denies nausea, vomiting, diarrhea.  Denies flank pain.  Patient reports this feels like a typical UTI to her.    Allergies   Allergen Reactions    Penicillins Anaphylaxis, Other (See Comments) and Rash     Other Reaction(s): DiffBreath , Difficulty Breathing    Has patient had a PCN reaction causing immediate rash, facial/tongue/throat swelling, SOB or lightheadedness with hypotension: Yes   Has patient had a PCN reaction causing severe rash involving mucus membranes or skin necrosis: No   Has patient had a PCN reaction that required hospitalization No   Has patient had a PCN reaction occurring within the last 10 years: Yes   If all of the above answers are NO, then may proceed with Cephalosporin use.    Event:      tolerates ancef    Sulfa Antibiotics Anaphylaxis and Hives     Other reaction(s): Hives, BREATHING ISSUES    Penicillin G      Other reaction(s): Hives, BREATHING ISSUES        Current Outpatient Medications on File Prior to Visit   Medication Sig Dispense Refill    colchicine  (COLCRYS ) 0.6 MG tablet Take two tablets x1, Then two hours later take one tablet 3 tablet 0    Rivaroxaban (XARELTO PO) Take by mouth      busPIRone (BUSPAR) 5 MG tablet 1 tablet Orally Twice a day      cetirizine (ZYRTEC) 10 MG tablet 1 tablet Orally Once a day      flecainide (TAMBOCOR) 50 MG tablet as needed      fluticasone (FLONASE) 50 MCG/ACT nasal spray  1 spray in each nostril Nasally Once a day      furosemide (LASIX) 20 MG tablet as needed      irbesartan (AVAPRO) 75 MG tablet 1 tablet Orally Once a day for 30 day(s)      magnesium oxide (MAG-OX) 400 MG tablet 1 tablet as needed Orally Once a day for 30 day(s)      metoprolol succinate (TOPROL XL) 25 MG extended release tablet 1 tablet Orally Once a day      potassium chloride (MICRO-K) 10 MEQ extended release capsule 1 capsule with food Orally Twice a day for 30 day(s)      rosuvastatin (CRESTOR) 10 MG tablet 1 tablet Orally Once a day      valACYclovir (VALTREX) 1 g tablet 1 tablet Orally every 24 hrs      zolpidem (AMBIEN) 5 MG tablet 1 tablet at bedtime Orally Once a day      methylPREDNISolone  (MEDROL  DOSEPACK) 4 MG tablet Take by mouth. (Patient not taking: Reported on 07/24/2023) 1 kit 0    acetaminophen (TYLENOL) 325 MG tablet 1 tablet as needed Orally every 4 hrs (Patient not taking: Reported on 08/27/2021)      apixaban (ELIQUIS) 5 MG TABS tablet Orally 2 times a day (Patient not taking: Reported on 08/27/2021)  Azilsartan Medoxomil (EDARBI) 40 MG TABS  (Patient not taking: Reported on 07/24/2023)      HYDROcodone-acetaminophen (NORCO) 5-325 MG per tablet 1 tablet as needed Orally every 6 hrs (Patient not taking: Reported on 07/24/2023)      LORazepam (ATIVAN) 1 MG tablet 1 tablet at bedtime as needed Orally Once a day (Patient not taking: Reported on 07/24/2023)      methylPREDNISolone  (MEDROL  DOSEPACK) 4 MG tablet  (Patient not taking: Reported on 08/27/2021)      nitrofurantoin , macrocrystal-monohydrate, (MACROBID ) 100 MG capsule 1 capsule with food Orally every 12 hrs FOR UNCOMPLICATED UTI for 5 days (Patient not taking: No sig reported)      omeprazole (PRILOSEC) 40 MG delayed release capsule 1 capsule (Patient not taking: No sig reported)      ondansetron (ZOFRAN) 4 MG tablet 1 tablet Orally every 4 hours as needed for nausea (Patient not taking: No sig reported)      phenazopyridine (PYRIDIUM)  200 MG tablet 1 tablet after meals Orally Three times a day AS NEEDED FOR BLADDER SPASMS for 2 day(s) (Patient not taking: No sig reported)       No current facility-administered medications on file prior to visit.            Past Medical History:   Diagnosis Date    Atrial fibrillation (HCC)     Hyperlipidemia     Hypertension         Past Surgical History:   Procedure Laterality Date    CARDIAC ELECTROPHYSIOLOGY MAPPING AND ABLATION  06/29/2021        Social History     Tobacco Use    Smoking status: Never    Smokeless tobacco: Never        Review of Systems  See HPI; otherwise ROS negative.         Objective   BP (!) 169/82 (Site: Left Upper Arm, Position: Sitting, Cuff Size: Large Adult)   Pulse 77   Temp 98 F (36.7 C)   Resp 18   Ht 1.626 m (5' 4)   Wt 83.9 kg (185 lb)   SpO2 98%   BMI 31.76 kg/m    Physical Exam  Vitals and nursing note reviewed.   Constitutional:       Appearance: Normal appearance. She is normal weight.   HENT:      Head: Normocephalic and atraumatic.      Mouth/Throat:      Mouth: Mucous membranes are moist.   Pulmonary:      Effort: Pulmonary effort is normal.   Abdominal:      General: Abdomen is flat. There is no distension.      Palpations: Abdomen is soft.      Tenderness: There is no abdominal tenderness. There is no right CVA tenderness or left CVA tenderness.   Musculoskeletal:         General: Normal range of motion.   Skin:     General: Skin is warm and dry.   Neurological:      Mental Status: She is alert.   Psychiatric:         Mood and Affect: Mood normal.         Behavior: Behavior normal.       No scans are attached to the encounter.  Results for orders placed or performed in visit on 07/24/23   POC Urinalysis, Auto W/O Scope (81003)   Result Value Ref Range    Color (UA POC)  Light Yellow     Clarity (UA POC) Slightly Cloudy     Glucose, Urine, POC Negative     Bilirubin, Urine, POC Negative     Ketones, Urine, POC Negative     Specific Gravity, Urine, POC 1.005  1.001 - 1.035    Blood (UA POC) Small     pH, Urine, POC 6.0 4.6 - 8.0    Protein, Urine, POC Negative     Urobilinogen, POC 0.2 mg/dL     Nitrite, Urine, POC Positive     Leukocyte Esterase, Urine, POC Large              ASSESSMENT/PLAN:  1. Frequent urination  -     POC Urinalysis, Auto W/O Scope (81003)  2. Acute cystitis without hematuria  -     nitrofurantoin , macrocrystal-monohydrate, (MACROBID ) 100 MG capsule; Take 1 capsule by mouth 2 times daily for 7 days, Disp-14 capsule, R-0Normal  -     Culture, Urine  Will treat as above for now. Patient to push fluids, rest. Monitor carefully for worsening signs or symptoms. Will send urine for culture and let patient know if we need to change her plan of care.  Patient to call our office or return with any questions, concerns or worsening signs or symptoms.    An electronic signature was used to authenticate this note.    --Izetta Leek, APRN - CNP

## 2023-07-26 LAB — CULTURE, URINE: FINAL REPORT: >100000 — AB

## 2024-01-04 ENCOUNTER — Ambulatory Visit: Admit: 2024-01-04 | Discharge: 2024-01-04 | Payer: MEDICARE | Primary: Family Medicine

## 2024-01-04 VITALS — BP 119/71 | HR 66 | Temp 98.10000°F | Resp 20 | Wt 182.0 lb

## 2024-01-04 DIAGNOSIS — G510 Bell's palsy: Secondary | ICD-10-CM

## 2024-01-04 MED ORDER — PREDNISONE 20 MG PO TABS
20 | ORAL_TABLET | Freq: Every day | ORAL | 0 refills | Status: AC
Start: 2024-01-04 — End: 2024-01-11

## 2024-01-04 NOTE — Progress Notes (Signed)
 Cindy Buck (DOB:  21-Nov-1950) is a 73 y.o. female, here for evaluation of the following chief complaint(s): Eye Problem (Pt complains of left eye drooping with blurry vision, sx started a few days ago)    SUBJECTIVE/OBJECTIVE:    This patient is a 73 year old female who presents today for evaluation of left lower eyelid drooping, with blurring of vision.  She notes symptom onset 3 days ago. She denies headache, dizziness, lightheadedness.  Denies numbness, tingling, weakness to upper or lower extremities.  Denies speech changes, facial droop.  Patient is otherwise feeling well.  Denies recent URI symptoms, fevers, chills, body aches.  Does note remote history of cataract surgery.  Patient takes Xarelto daily, taking as prescribed.     Allergies   Allergen Reactions    Penicillins Anaphylaxis, Other (See Comments) and Rash     Other Reaction(s): DiffBreath , Difficulty Breathing    Has patient had a PCN reaction causing immediate rash, facial/tongue/throat swelling, SOB or lightheadedness with hypotension: Yes   Has patient had a PCN reaction causing severe rash involving mucus membranes or skin necrosis: No   Has patient had a PCN reaction that required hospitalization No   Has patient had a PCN reaction occurring within the last 10 years: Yes   If all of the above answers are "NO", then may proceed with Cephalosporin use.    Event:      tolerates ancef    Sulfa Antibiotics Anaphylaxis and Hives     Other reaction(s): Hives, BREATHING ISSUES    Penicillin G      Other reaction(s): Hives, BREATHING ISSUES        Current Outpatient Medications on File Prior to Visit   Medication Sig Dispense Refill    torsemide (DEMADEX) 20 MG tablet TAKE 1 TABLET BY MOUTH EVERY DAY. OR IF YOU NOTICE WORSENING LEG SWELLING FOR WEIGHT GAIN TKE 2 DAILY FOR 3 DAYS AND CONTACT CARDIOLOGY CLINIC      amiodarone (CORDARONE) 200 MG tablet Take 1 tablet by mouth daily      methenamine (HIPREX) 1 g tablet Take 1 tablet by mouth 2 times  daily      Rivaroxaban (XARELTO PO) Take by mouth      busPIRone (BUSPAR) 5 MG tablet 1 tablet Orally Twice a day      cetirizine (ZYRTEC) 10 MG tablet 1 tablet Orally Once a day      fluticasone (FLONASE) 50 MCG/ACT nasal spray 1 spray in each nostril Nasally Once a day      furosemide (LASIX) 20 MG tablet as needed      magnesium oxide (MAG-OX) 400 MG tablet 1 tablet as needed Orally Once a day for 30 day(s)      metoprolol succinate (TOPROL XL) 25 MG extended release tablet 1 tablet Orally Once a day      potassium chloride (MICRO-K) 10 MEQ extended release capsule 1 capsule with food Orally Twice a day for 30 day(s)      rosuvastatin (CRESTOR) 10 MG tablet 1 tablet Orally Once a day      valACYclovir (VALTREX) 1 g tablet 1 tablet Orally every 24 hrs      zolpidem (AMBIEN) 5 MG tablet 1 tablet at bedtime Orally Once a day      colchicine (COLCRYS) 0.6 MG tablet Take two tablets x1, Then two hours later take one tablet (Patient not taking: Reported on 01/04/2024) 3 tablet 0    methylPREDNISolone (MEDROL DOSEPACK) 4 MG tablet Take by mouth. (Patient  not taking: Reported on 07/24/2023) 1 kit 0    acetaminophen (TYLENOL) 325 MG tablet 1 tablet as needed Orally every 4 hrs (Patient not taking: Reported on 08/27/2021)      apixaban (ELIQUIS) 5 MG TABS tablet Orally 2 times a day (Patient not taking: Reported on 08/27/2021)      Azilsartan Medoxomil (EDARBI) 40 MG TABS  (Patient not taking: Reported on 07/24/2023)      flecainide (TAMBOCOR) 50 MG tablet as needed (Patient not taking: Reported on 01/04/2024)      HYDROcodone-acetaminophen (NORCO) 5-325 MG per tablet 1 tablet as needed Orally every 6 hrs (Patient not taking: Reported on 07/24/2023)      irbesartan (AVAPRO) 75 MG tablet 1 tablet Orally Once a day for 30 day(s) (Patient not taking: Reported on 01/04/2024)      LORazepam (ATIVAN) 1 MG tablet 1 tablet at bedtime as needed Orally Once a day (Patient not taking: Reported on 07/24/2023)      methylPREDNISolone (MEDROL  DOSEPACK) 4 MG tablet  (Patient not taking: Reported on 08/27/2021)      nitrofurantoin, macrocrystal-monohydrate, (MACROBID) 100 MG capsule 1 capsule with food Orally every 12 hrs FOR UNCOMPLICATED UTI for 5 days (Patient not taking: Reported on 08/04/2021)      omeprazole (PRILOSEC) 40 MG delayed release capsule 1 capsule (Patient not taking: Reported on 08/04/2021)      ondansetron (ZOFRAN) 4 MG tablet 1 tablet Orally every 4 hours as needed for nausea (Patient not taking: Reported on 08/04/2021)      phenazopyridine (PYRIDIUM) 200 MG tablet 1 tablet after meals Orally Three times a day AS NEEDED FOR BLADDER SPASMS for 2 day(s) (Patient not taking: Reported on 08/04/2021)       No current facility-administered medications on file prior to visit.            Past Medical History:   Diagnosis Date    Atrial fibrillation (HCC)     Hyperlipidemia     Hypertension         Past Surgical History:   Procedure Laterality Date    CARDIAC ELECTROPHYSIOLOGY MAPPING AND ABLATION  06/29/2021        Social History     Tobacco Use    Smoking status: Never    Smokeless tobacco: Never        Review of Systems  See HPI; otherwise ROS negative.         Objective   BP 119/71   Pulse 66   Temp 98.1 F (36.7 C)   Resp 20   Wt 82.6 kg (182 lb)   SpO2 100%   BMI 31.24 kg/m    Physical Exam  Vitals and nursing note reviewed.   Constitutional:       General: She is not in acute distress.     Appearance: Normal appearance. She is normal weight. She is not ill-appearing.   HENT:      Head: Normocephalic.      Mouth/Throat:      Mouth: Mucous membranes are moist.      Pharynx: Oropharynx is clear.   Eyes:      General: Vision grossly intact. No visual field deficit.     Extraocular Movements: Extraocular movements intact.      Conjunctiva/sclera: Conjunctivae normal.      Left eye: Left conjunctiva is not injected.      Pupils: Pupils are equal, round, and reactive to light.      Comments: Left lower lid  drooping.    Cardiovascular:       Rate and Rhythm: Normal rate and regular rhythm.      Heart sounds: Normal heart sounds.   Pulmonary:      Effort: Pulmonary effort is normal.      Breath sounds: Normal breath sounds.   Musculoskeletal:      Cervical back: Neck supple.   Skin:     General: Skin is warm and dry.      Capillary Refill: Capillary refill takes less than 2 seconds.   Neurological:      Mental Status: She is alert and oriented to person, place, and time.      Cranial Nerves: No dysarthria.      Sensory: Sensation is intact.      Motor: Motor function is intact. No pronator drift.      Coordination: Coordination is intact.      Gait: Gait is intact.      Comments: Facial asymmetry of the forehead wrinkles, patient unable to raise left eyebrow on exam.  Left lid drooping slightly.   No mouth asymmetry on exam.    Psychiatric:         Mood and Affect: Mood normal.         Behavior: Behavior normal.       No scans are attached to the encounter.  No results found for any visits on 01/04/24.          ASSESSMENT/PLAN:  1. Bell's palsy  -     predniSONE (DELTASONE) 20 MG tablet; Take 3 tablets by mouth daily for 7 days, Disp-21 tablet, R-0Normal  -     [facility] - [address]  Exam and history consistent with Bell's palsy.  No other focal neurodeficits noted on exam.  Palsy isolated to the left forehead and eye, no lower facial involvement. Will treat as above.  Advised patient to utilize artificial tears to maintain moisture of her left eye.  Advised to tape eyelid closed at night while sleeping.  Long conversation with patient regarding monitoring for other neurodeficits.  Should she develop any numbness, tingling, weakness, headaches, dizziness seek immediate evaluation in the emergency room.  She is agreeable to above plan.    Patient to call our office or return with any questions, concerns or worsening signs or symptoms.    An electronic signature was used to authenticate this note.    --Cathleen Fears,  APRN - CNP

## 2024-04-04 ENCOUNTER — Ambulatory Visit
Admit: 2024-04-04 | Discharge: 2024-04-04 | Payer: Medicare (Managed Care) | Attending: Physician Assistant | Primary: Family Medicine

## 2024-04-04 VITALS — BP 129/73 | HR 63 | Temp 98.10000°F | Resp 16 | Wt 185.0 lb

## 2024-04-04 DIAGNOSIS — L299 Pruritus, unspecified: Secondary | ICD-10-CM

## 2024-04-04 MED ORDER — PREDNISONE 10 MG PO TABS
10 | ORAL_TABLET | Freq: Every day | ORAL | 0 refills | 7.00000 days | Status: AC
Start: 2024-04-04 — End: 2024-04-09

## 2024-04-04 NOTE — Progress Notes (Signed)
 Cindy Buck (DOB:  [birth year]) is a 73 y.o. female,here for evaluation of the following chief complaint(s):  Pruritis (Patient reports right foot itching and redness - started two days ago. )      Assessment & Plan     Vitals:    04/04/24 1525   BP: 129/73   Pulse:    Resp:    Temp:    SpO2:        Subjective   SUBJECTIVE/OBJECTIVE:  Cindy Buck is a 73 y/o female that presents with skin rash and itchy feet. Patient states that about 8 months ago, she was seen in [place]  with the same thing. Patient states that after an investigation, she was treated with prednisone  that helped significantly.  It is suspected that the patient may be allergic to something but she is unsure what.  She is expected to also follow-up with an allergist when she travels back to [place] .          Past Medical History:   Diagnosis Date    Atrial fibrillation (HCC)     Hyperlipidemia     Hypertension         Past Surgical History:   Procedure Laterality Date    CARDIAC ELECTROPHYSIOLOGY MAPPING AND ABLATION  06/29/2021        Allergies   Allergen Reactions    Penicillins Anaphylaxis, Other (See Comments) and Rash     Other Reaction(s): DiffBreath , Difficulty Breathing    Has patient had a PCN reaction causing immediate rash, facial/tongue/throat swelling, SOB or lightheadedness with hypotension: Yes   Has patient had a PCN reaction causing severe rash involving mucus membranes or skin necrosis: No   Has patient had a PCN reaction that required hospitalization No   Has patient had a PCN reaction occurring within the last 10 years: Yes   If all of the above answers are "NO", then may proceed with Cephalosporin use.    Event:      tolerates ancef    Sulfa Antibiotics Anaphylaxis and Hives     Other reaction(s): Hives, BREATHING ISSUES    Penicillin G      Other reaction(s): Hives, BREATHING ISSUES          Review of Systems   Skin:  Positive for rash.            Objective     Physical Exam  Constitutional:       Appearance: Normal appearance. She is  not ill-appearing.   HENT:      Head: Normocephalic.   Eyes:      Conjunctiva/sclera: Conjunctivae normal.   Pulmonary:      Effort: Pulmonary effort is normal.   Skin:     Findings: Erythema and rash present.   Neurological:      Mental Status: She is alert and oriented to person, place, and time.   Psychiatric:         Mood and Affect: Mood normal.         Behavior: Behavior normal.         Thought Content: Thought content normal.              No results found for any visits on 04/04/24.     ASSESSMENT/PLAN:    1. Itchy skin  -     CBC with Auto Differential  -     Comprehensive Metabolic Panel; Future  -     predniSONE  (DELTASONE ) 10 MG tablet; Take 1 tablet by mouth daily  for 5 days, Disp-5 tablet, R-0Normal  -     Hemoglobin A1C; Future  2. Skin rash  -     CBC with Auto Differential  -     Comprehensive Metabolic Panel; Future  -     predniSONE  (DELTASONE ) 10 MG tablet; Take 1 tablet by mouth daily for 5 days, Disp-5 tablet, R-0Normal  -     Hemoglobin A1C; Future  3. Localized swelling of right foot  -     Hemoglobin A1C; Future  4. Screening for diabetes mellitus (DM)  -     Hemoglobin A1C; Future      Return in about 1 week (around 04/11/2024), or if symptoms worsen or fail to improve w/ PCP.       Orders Placed This Encounter   Medications    predniSONE  (DELTASONE ) 10 MG tablet     Sig: Take 1 tablet by mouth daily for 5 days     Dispense:  5 tablet     Refill:  0      I have spoken with the extensively with patient today. It is important to realize that of course your visit today is simply a moment in time and that your medical condition can certainly change and become worse, necessitating re-evaluation in an urgent or emergent manner. I have explained the patient's condition, diagnoses and treatment plan based on the information available to me at this time. Lack of an acute emergency condition does not mean that there is not a problem, nor does it replace a thorough exam performed by a primary care  physician.     I have spoken with the extensively with patient today. I have explained the patient's condition, diagnoses and treatment plan based on the information available to me at this time. I have answered the patient's questions and addressed any concerns. The patient has a good an understanding of their diagnosis, condition and treatment plan as can be expected at this point. The vital signs have been stable. The patient's condition is stable and appropriate for discharge from the express care.  The patient will pursue further outpatient evaluation with the primary care physician or other designated or consulting physician as outlined in the discharge instructions. The patient is agreeable to this plan of care and follow-up instructions have been explained in detail. The patient is aware that any significant change in condition or worsening of symptoms should prompt an immediate return to this office or the closest emergency department        I have reviewed prior visit notes and lab results pertinent to this visit. Past medical history, surgical history, family history, social history, medications, and allergies have been reviewed are are documented in the [facility] medical record for this visit. Point of care results and imaging were discussed with the patient. Educated the patient on disease process, expected course of illness, and management. Educated the patient on how to administer prescribed medications and possible side effects.      It should also be noted that The 21st Century Cures Act requires that medical notes like this be available to patients in the interest of transparency. However, be advised this is a medical document. It is intended as peer to peer communication. It is written in medical language and may contain abbreviations or verbiage that are unfamiliar. It may appear blunt or direct. Medical documents are intended to carry relevant information, facts as evident, and the clinical opinion  of the practitioner.)  An electronic signature was used to authenticate this note.    --Florencio Hunting, DMSc, PA-C, FAAPA

## 2024-04-04 NOTE — Progress Notes (Signed)
 Venipuncture: 23 gauge needle used, site used: left AC, site cleansed and pressure applied, patient tolerated procedure well, 3 tube(s) collected.

## 2024-04-05 ENCOUNTER — Encounter

## 2024-04-05 LAB — COMPREHENSIVE METABOLIC PANEL
ALT: 34 U/L (ref 0–42)
AST: 35 U/L (ref 0–46)
Albumin/Globulin Ratio: 1.7 (ref 1.00–2.70)
Albumin: 4 g/dL (ref 3.5–5.2)
Alk Phosphatase: 96 U/L (ref 35–117)
Anion Gap: 14 mmol/L (ref 2–17)
BUN: 25 mg/dL — ABNORMAL HIGH (ref 8–23)
CO2: 19 mmol/L — ABNORMAL LOW (ref 22–29)
Calcium: 9.1 mg/dL (ref 8.5–10.7)
Chloride: 100 mmol/L (ref 98–107)
Creatinine: 1 mg/dL (ref 0.5–1.0)
Est, Glom Filt Rate: 60 mL/min/1.73mÂ² (ref >=60)
Globulin: 2.3 g/dL (ref 1.9–4.4)
Glucose: 86 mg/dL (ref 70–99)
Osmolaliy Calculated: 270 mosm/kg (ref 270–287)
Potassium: 4.2 mmol/L (ref 3.5–5.3)
Sodium: 133 mmol/L — ABNORMAL LOW (ref 135–145)
Total Bilirubin: 0.35 mg/dL (ref 0.00–1.20)
Total Protein: 6.3 g/dL (ref 5.7–8.3)

## 2024-04-05 LAB — CBC WITH AUTO DIFFERENTIAL
Basophils %: 0.2 % (ref 0.0–2.0)
Basophils Absolute: 0 10*3/uL (ref 0.0–0.2)
Eosinophils %: 1.6 % (ref 0.0–7.0)
Eosinophils Absolute: 0.1 10*3/uL (ref 0.0–0.5)
Hematocrit: 34.7 % (ref 34.0–47.0)
Hemoglobin: 10.8 g/dL — ABNORMAL LOW (ref 11.5–15.7)
Immature Grans (Abs): 0.01 10*3/uL (ref 0.00–0.06)
Immature Granulocytes %: 0.2 % (ref 0.0–0.6)
Lymphocytes Absolute: 0.8 10*3/uL — ABNORMAL LOW (ref 1.0–3.2)
Lymphocytes: 14.1 % — ABNORMAL LOW (ref 15.0–45.0)
MCH: 35.8 pg — ABNORMAL HIGH (ref 27.0–34.5)
MCHC: 31.1 g/dL (ref 30.0–36.0)
MCV: 114.9 fL — ABNORMAL HIGH (ref 81.0–99.0)
MPV: 10.2 fL (ref 7.0–12.2)
Monocytes %: 17.4 % — ABNORMAL HIGH (ref 4.0–12.0)
Monocytes Absolute: 1 10*3/uL (ref 0.3–1.0)
NRBC Absolute: 0 10*3/uL (ref 0.000–0.012)
NRBC Automated: 0 % (ref 0.0–0.2)
Neutrophils %: 66.5 % (ref 42.0–74.0)
Neutrophils Absolute: 3.8 10*3/uL (ref 1.6–7.3)
Platelets: 150 10*3/uL (ref 140–440)
RBC: 3.02 x10e6/mcL — ABNORMAL LOW (ref 3.60–5.20)
RDW: 15.2 % (ref 10.0–17.0)
WBC: 5.7 10*3/uL (ref 3.8–10.6)

## 2024-04-05 LAB — HEMOGLOBIN A1C
Estimated Avg Glucose: 94
Estimated Avg Glucose: 97
Hemoglobin A1C: 4.9 % (ref 4.0–6.0)

## 2024-04-05 LAB — MORPHOLOGY CHECK: RBC Morphology: ABNORMAL — AB

## 2024-04-05 NOTE — Other (Signed)
 Discussed results with patient. No hx of anemia. No complaints of bleeding, melena, etc. She will need further evaluation. We will schedule her with primary care.

## 2024-04-08 NOTE — Other (Signed)
 Patient sent to PCP to further evaluate anemia.

## 2024-04-09 ENCOUNTER — Ambulatory Visit
Admit: 2024-04-09 | Discharge: 2024-04-09 | Payer: Medicare (Managed Care) | Attending: Family Medicine | Primary: Family Medicine

## 2024-04-09 ENCOUNTER — Encounter

## 2024-04-09 DIAGNOSIS — I480 Paroxysmal atrial fibrillation: Secondary | ICD-10-CM

## 2024-04-09 NOTE — Progress Notes (Signed)
 Cindy Buck (DOB:  Mar 05, 1951) is a 73 y.o. female, here for evaluation of the following chief complaint(s):  New Patient (Anemia ) and Blood Work (results)    Vitals:    04/09/24 1452   BP: 132/72   Pulse: 68   SpO2: 97%   Weight: 78.4 kg (172 lb 12.8 oz)   Height: 1.613 m (5' 3.5)      Assessment & Plan   1. Paroxysmal atrial fibrillation (HCC)  2. Routine general medical examination at a health care facility  3. Macrocytosis  -     Vitamin B12; Future  -     Folate; Future  -     Comprehensive Metabolic Panel; Future  4. Primary osteoarthritis involving multiple joints    In addition to the above:  Assessment & Plan  1. Right foot swelling.  - The right foot swelling and itching may be due to a hypersensitive skin reaction potentially triggered by new medications such as Zepbound, which she started in 12/2023.  - The possibility of gout was considered but deemed unlikely due to the absence of intense pain.  - Prednisone  was previously effective in managing the symptoms.  - She will switch from Zyrtec to another antihistamine to address potential tolerance. If the rash recurs, she is advised to photodocument it for further evaluation.    2. Anemia.  - Her hemoglobin level was 10.8, a decrease from 14.2 five years ago.  - She has a history of B12 deficiency and was previously on B12 injections.  - Additional blood work will be conducted to investigate the cause of her anemia. If the lab can add tests to the recent blood sample, it will be done; otherwise, she will return for a blood draw after her trip.  - No unusual bruising, bleeding, or other symptoms were reported.    3. Atrial fibrillation.  - She is currently on Xarelto for atrial fibrillation and has been on it for about a year and a half. She was previously on Eliquis.  - She will continue her current medication regimen.  - The amiodarone is planned to be discontinued once she is no longer traveling.  - No dark stools, nausea, or vomiting blood were  reported.    4. Arthritis.  - She has arthritis in her fingers  - A prescription for meloxicam 30 tablets will be sent to pharmacy.  - No active stomach problems were reported, so omeprazole will be discontinued unless needed.  - Discussed risks of NSAIDs vs benefit, and monitor for signs and symptoms.    5. Hyponatremia.  - Her sodium levels were low, possibly due to diuretic use (torsemide).  - She is advised to maintain a daily fluid intake of 64 to 80 ounces.  - Sodium levels will be rechecked after her trip.  - Torsemide dosage will be adjusted based on swelling.  No follow-ups on file.     Subjective   History of Present Illness  The patient presents for evaluation of right foot swelling, atrial fibrillation, anemia, and arthritis.    She has been experiencing recurrent episodes of swelling in her right foot, extending to the toes and accompanied by severe itching. The symptoms do not extend beyond the ankle. A similar episode occurred approximately 2 months ago, initially affecting the left foot before transitioning to the right. There were a couple of weeks in between these episodes. She reports no changes in footwear or topical applications, except for occasional use of Vaseline on her  feet. She has been on Zepbound since 12/2023, with no associated gastrointestinal issues. She reports no sinus congestion, runny nose, sore throat, or cough. She has a history of seasonal allergies and takes Zyrtec daily but has not tried other antihistamines. She has a past medical history of gout but does not believe the current symptoms are related. She has no history of unusual rashes. During her visit to Urgent care, she was prescribed prednisone , which effectively alleviated her symptoms.    Recent blood work revealed low hemoglobin levels. She has a history of B12 deficiency and used to receive B12 injections regularly. She bruises easily and is currently on Xarelto for atrial fibrillation, which she believes  contributes to her bruising. She reports no presence of dark, black stools, nausea, vomiting, or coughing up blood. She has been on Xarelto for approximately 1.5 years, having switched from Eliquis.    She is currently on torsemide daily and has not experienced any issues with sodium levels while on this medication. She takes 2 tablets if she experiences swelling.    She has some arthritis in her fingers and takes Celebrex as needed.    She has a local cardiologist but has not consulted them recently. She underwent ablation for atrial fibrillation, after which she experienced a recurrence of atrial fibrillation but is currently in normal rhythm. She was hospitalized for 4 days in 09/2023 due to septic shock from a urinary tract infection (UTI) and was started on amiodarone. She has a history of UTIs. She is currently on amiodarone, which they plan to discontinue once her travel schedule stabilizes.    She burnt the top of her toes yesterday, but it does not hurt.    PAST SURGICAL HISTORY:  Ablation for atrial fibrillation         Objective   Physical Exam   Physical Exam  Respiratory: Clear to auscultation, no wheezing, rales or rhonchi  General Appearance:  Well appearing, developed and nourished.  Appears stated age.  No acute distress.  Head:  Normocephalic, atraumatic.  Eyes:  Pupils equal, round and reactive to light.  Extraocular muscle movements intact  Ears:  External auditory canals clear, tympanic membranes intact.  Hearing grossly intact.  Nose:  Nares patent, no lesions.  Oral Cavity:  Mucosa moist, tongue in midline.  No oropharyngeal lesions.  Neck:  Supple, full range of motion.  No cervical lymphadenopathy.  No thyroid enlargement.  Heart:  Regular rate and regular rhythm.  No murmurs.  No rubs or gallops.  Lungs:  Clear to auscultation bilaterally with good air movement, no focal adventitial sounds.  Abdomen:  Non tender to palpation and non distended.  No masses or hernias. No  organomegaly.  Skin:  Normal turgor.  No rashes noted.  Skin is warm and dry.  Musculoskeletal:  Full range of motion with no swelling or deformity.  Extremities:  No edema.  No clubbing or cyanosis.  Neurologic:  Motor strength normal in the upper and lower extremities.  Sensory exam intact.  Psych:  Cognitive function intact.  Judgment and insight normal.  Mood and affect appropriate.          Orders Only on 04/09/2024   Component Date Value Ref Range Status    Folate 04/04/2024 5.12  4.80 - 24.20 ng/mL Final    Vitamin B-12 04/04/2024 >2000 (H)  232 - 1245 pg/mL Final   Orders Only on 04/05/2024   Component Date Value Ref Range Status    Hemoglobin A1C 04/04/2024  4.9  4.0 - 6.0 % Final    Estimated Avg Glucose 04/04/2024 94   Final    Estimated Avg Glucose 04/04/2024 97   Final    Sodium 04/04/2024 133 (L)  135 - 145 mmol/L Final    Potassium 04/04/2024 4.2  3.5 - 5.3 mmol/L Final    Chloride 04/04/2024 100  98 - 107 mmol/L Final    CO2 04/04/2024 19 (L)  22 - 29 mmol/L Final    Glucose 04/04/2024 86  70 - 99 mg/dL Final    BUN 94/70/7974 25 (H)  8 - 23 mg/dL Final    Creatinine 94/70/7974 1.0  0.5 - 1.0 mg/dL Final    Anion Gap 94/70/7974 14  2 - 17 mmol/L Final    Osmolaliy Calculated 04/04/2024 270  270 - 287 mOsm/kg Final    Calcium 04/04/2024 9.1  8.5 - 10.7 mg/dL Final    Total Protein 04/04/2024 6.3  5.7 - 8.3 g/dL Final    Albumin 94/70/7974 4.0  3.5 - 5.2 g/dL Final    Globulin 94/70/7974 2.3  1.9 - 4.4 g/dL Final    Albumin/Globulin Ratio 04/04/2024 1.70  1.00 - 2.70 Final    Total Bilirubin 04/04/2024 0.35  0.00 - 1.20 mg/dL Final    Alk Phosphatase 04/04/2024 96  35 - 117 unit/L Final    AST 04/04/2024 35  0 - 46 unit/L Final    ALT 04/04/2024 34  0 - 42 unit/L Final    Est, Glom Filt Rate 04/04/2024 60  >=60 mL/min/1.50m Final   Orders Only on 04/04/2024   Component Date Value Ref Range Status    RBC Morphology 04/04/2024 Abnormal (A)  Normal Final    Macrocytes 04/04/2024 Few (A)   Final     Spherocytes 04/04/2024 Few (A)   Final    Platelet Estimate 04/04/2024 Not Indicated   Final   Office Visit on 04/04/2024   Component Date Value Ref Range Status    WBC 04/04/2024 5.7  3.8 - 10.6 x10e3/mcL Final    RBC 04/04/2024 3.02 (L)  3.60 - 5.20 x10e6/mcL Final    Hemoglobin 04/04/2024 10.8 (L)  11.5 - 15.7 g/dL Final    Hematocrit 94/70/7974 34.7  34.0 - 47.0 % Final    MCV 04/04/2024 114.9 (H)  81.0 - 99.0 fL Final    MCH 04/04/2024 35.8 (H)  27.0 - 34.5 pg Final    MCHC 04/04/2024 31.1  30.0 - 36.0 g/dL Final    RDW 94/70/7974 15.2  10.0 - 17.0 % Final    Platelets 04/04/2024 150  140 - 440 x10e3/mcL Final    MPV 04/04/2024 10.2  7.0 - 12.2 fL Final    NRBC Automated 04/04/2024 0.0  0.0 - 0.2 % Final    NRBC Absolute 04/04/2024 0.000  0.000 - 0.012 x10e3/mcL Final    Neutrophils % 04/04/2024 66.5  42.0 - 74.0 % Final    Lymphocytes 04/04/2024 14.1 (L)  15.0 - 45.0 % Final    Monocytes % 04/04/2024 17.4 (H)  4.0 - 12.0 % Final    Eosinophils % 04/04/2024 1.6  0.0 - 7.0 % Final    Basophils % 04/04/2024 0.2  0.0 - 2.0 % Final    Neutrophils Absolute 04/04/2024 3.8  1.6 - 7.3 x10e3/mcL Final    Lymphocytes Absolute 04/04/2024 0.8 (L)  1.0 - 3.2 x10e3/mcL Final    Monocytes Absolute 04/04/2024 1.0  0.3 - 1.0 x10e3/mcL Final    Eosinophils Absolute 04/04/2024 0.1  0.0 - 0.5 x10e3/mcL Final    Basophils Absolute 04/04/2024 0.0  0.0 - 0.2 x10e3/mcL Final    Immature Granulocytes % 04/04/2024 0.2  0.0 - 0.6 % Final    Immature Grans (Abs) 04/04/2024 0.01  0.00 - 0.06 x10e3/mcL Final   Office Visit on 07/24/2023   Component Date Value Ref Range Status    Color (UA POC) 07/24/2023 Light Yellow   Final    Clarity (UA POC) 07/24/2023 Slightly Cloudy   Final    Glucose, Urine, POC 07/24/2023 Negative   Final    Bilirubin, Urine, POC 07/24/2023 Negative   Final    Ketones, Urine, POC 07/24/2023 Negative   Final    Specific Gravity, Urine, POC 07/24/2023 1.005  1.001 - 1.035 Final    Blood (UA POC) 07/24/2023 Small    Final    pH, Urine, POC 07/24/2023 6.0  4.6 - 8.0 Final    Protein, Urine, POC 07/24/2023 Negative   Final    Urobilinogen, POC 07/24/2023 0.2 mg/dL   Final    Nitrite, Urine, POC 07/24/2023 Positive   Final    Leukocyte Esterase, Urine, POC 07/24/2023 Large   Final    FINAL REPORT 07/24/2023 >100,000 cfu/ml Escherichia coli isolated. (A)   Final    Organism 07/24/2023 Escherichia coli (A)   Final      Orders Only on 04/05/2024   Component Date Value Ref Range Status    Hemoglobin A1C 04/04/2024 4.9  4.0 - 6.0 % Final    Comment: HEMOGLOBIN A1C INTERPRETATION:    The following arbitrary ranges may be used for interpretation of the results.  However, factors such as duration of diabetes, adherence to therapy, and  patient age should also be considered in assessing degree of blood glucose  control.    Hemoglobin A1C                 Avg. Blood Sugar  --------------------------------------------------------------  6%                           135 mg/dL  7%                           170 mg/dL  8%                           205 mg/dL  9%                           240 mg/dL  89%                          275 mg/dL    ======================================================    A1C                      Glucose Control  ----------------------------------------------------------------  < 6.0 %                   Normal  6.0 - 6.9 %               Abnormal  7.0 - 7.9 %               Sub-Optimal Control  > 8.0 %  Inadequate Control      Estimated Avg Glucose 04/04/2024 94   Final    Estimated Avg Glucose 04/04/2024 97   Final    Sodium 04/04/2024 133 (L)  135 - 145 mmol/L Final    Potassium 04/04/2024 4.2  3.5 - 5.3 mmol/L Final    Chloride 04/04/2024 100  98 - 107 mmol/L Final    CO2 04/04/2024 19 (L)  22 - 29 mmol/L Final    Glucose 04/04/2024 86  70 - 99 mg/dL Final    BUN 94/70/7974 25 (H)  8 - 23 mg/dL Final    Creatinine 94/70/7974 1.0  0.5 - 1.0 mg/dL Final    Anion Gap 94/70/7974 14  2 - 17 mmol/L Final     Osmolaliy Calculated 04/04/2024 270  270 - 287 mOsm/kg Final    Calcium 04/04/2024 9.1  8.5 - 10.7 mg/dL Final    Total Protein 04/04/2024 6.3  5.7 - 8.3 g/dL Final    Albumin 94/70/7974 4.0  3.5 - 5.2 g/dL Final    Globulin 94/70/7974 2.3  1.9 - 4.4 g/dL Final    Albumin/Globulin Ratio 04/04/2024 1.70  1.00 - 2.70 Final    Total Bilirubin 04/04/2024 0.35  0.00 - 1.20 mg/dL Final    Alk Phosphatase 04/04/2024 96  35 - 117 unit/L Final    AST 04/04/2024 35  0 - 46 unit/L Final    ALT 04/04/2024 34  0 - 42 unit/L Final    Est, Glom Filt Rate 04/04/2024 60  >=60 mL/min/1.49m Final    Comment: VERIFIED by Discern Expert.  GFR Interpretation:                                                                         % OF  KIDNEY  GFR                                                        STAGE  FUNCTION  ==================================================================================    > 90        Normal kidney function                       STAGE 1  90-100%  89 to 60      Mild loss of kidney function                 STAGE 2  80-60%  59 to 45      Mild to moderate loss of kidney function     STAGE 3a  59-45%  44 to 30      Moderate to severe loss of kidney function   STAGE 3b  44-30%  29 to 15      Severe loss of kidney function               STAGE 4  29-15%    < 15        Kidney failure  STAGE 5  <15%  ==================================================================================  Modified from [employer]    GFR Calculation performed using the CKD-EPI 2021 equation developed for use  with IDMS traceable creatinine methods and                            is the calculation recommended by  the [facility] for estimating GFR in adults.        No results found for this visit on 04/09/24.   Results for orders placed or performed in visit on 04/09/24   Folate   Result Value Ref Range    Folate 5.12 4.80 - 24.20 ng/mL   Vitamin B12   Result Value Ref Range     Vitamin B-12 >2000 (H) 232 - 1245 pg/mL     Allergies   Allergen Reactions    Penicillins Anaphylaxis, Other (See Comments) and Rash     Other Reaction(s): DiffBreath , Difficulty Breathing    Has patient had a PCN reaction causing immediate rash, facial/tongue/throat swelling, SOB or lightheadedness with hypotension: Yes   Has patient had a PCN reaction causing severe rash involving mucus membranes or skin necrosis: No   Has patient had a PCN reaction that required hospitalization No   Has patient had a PCN reaction occurring within the last 10 years: Yes   If all of the above answers are NO, then may proceed with Cephalosporin use.    Event:      tolerates ancef    Sulfa Antibiotics Anaphylaxis and Hives     Other reaction(s): Hives, BREATHING ISSUES    Penicillin G      Other reaction(s): Hives, BREATHING ISSUES      Prior to Admission medications    Medication Sig Start Date End Date Taking? Authorizing Provider   metoprolol tartrate (LOPRESSOR) 25 MG tablet Take 0.5 tablets by mouth 2 times daily 03/31/24  Yes [provider]   estradiol (ESTRACE VAGINAL) 0.1 MG/GM vaginal cream See Instructions, Use a pearl sized amount inside the vagina. Use every night at first for 2 weeks, then  2-3 times a week., # 42.5 g, 3 Refill(s), Signed: 10/17/23 10:21:00 PM MST, Maintenance, Pharmacy: [facility] [phone], Use a pearl sized amount inside the vagina. Use every night at first for 2 weeks, then  2-3 times a week., 163, cm, 10/17/23 13:55:00 MST, Height/Length Measured, 79.4, kg, 10/17/23 13:58:00 MST, Weight Dosing, H/O acute cystitis 10/18/23  Yes [provider]   tirzepatide-weight management (ZEPBOUND) 5 MG/0.5ML SOLN subCUTAneous injection (VIAL) Inject 0.5 mLs into the skin 01/10/24  Yes [provider]   MILK THISTLE EXTRACT PO Take by mouth 10/03/23  Yes [provider]   torsemide (DEMADEX) 20 MG tablet TAKE 1 TABLET BY MOUTH EVERY DAY. OR IF YOU NOTICE WORSENING LEG  SWELLING FOR WEIGHT GAIN TKE 2 DAILY FOR 3 DAYS AND CONTACT CARDIOLOGY CLINIC 12/19/23  Yes [provider]   amiodarone (CORDARONE) 200 MG tablet Take 1 tablet by mouth daily   Yes [provider]   acetaminophen (TYLENOL) 325 MG tablet    Yes [facility] Automatic Reconciliation, Rsfh, MD   busPIRone (BUSPAR) 5 MG tablet 1 tablet Orally Twice a day   Yes [facility] Automatic Reconciliation, Rsfh, MD   cetirizine (ZYRTEC) 10 MG tablet 1 tablet Orally Once a day   Yes [facility] Automatic Reconciliation, Rsfh, MD   fluticasone (FLONASE) 50 MCG/ACT nasal spray 1 spray in each  nostril Nasally Once a day   Yes [facility] Automatic Reconciliation, Rsfh, MD   magnesium oxide (MAG-OX) 400 MG tablet 1 tablet as needed Orally Once a day for 30 day(s)   Yes [facility] Automatic Reconciliation, Rsfh, MD   rosuvastatin (CRESTOR) 10 MG tablet 1 tablet Orally Once a day   Yes [facility] Automatic Reconciliation, Rsfh, MD   valACYclovir (VALTREX) 1 g tablet 1 tablet Orally every 24 hrs   Yes [facility] Automatic Reconciliation, Rsfh, MD   zolpidem (AMBIEN) 5 MG tablet 1 tablet at bedtime Orally Once a day   Yes [facility] Automatic Reconciliation, Rsfh, MD   folic acid  (FOLVITE ) 1 MG tablet Take 1 tablet by mouth daily 04/26/24   Greenwell, Irl B, MD      Family History   Problem Relation Age of Onset    Colon Cancer Mother     Hearing Loss Mother     Asthma Father     Heart Attack Father     High Blood Pressure Father     Atrial Fibrillation Brother     Cancer Brother       Social History     Socioeconomic History    Marital status: Married     Spouse name: Not on file    Number of children: Not on file    Years of education: Not on file    Highest education level: Not on file   Occupational History    Not on file   Tobacco Use    Smoking status: Never    Smokeless tobacco: Never   Substance and Sexual Activity    Alcohol use: Yes     Alcohol/week: 6.0 standard drinks of alcohol     Types: 6 Cans of beer per week    Drug use: Never    Sexual activity: Yes      Partners: Male   Other Topics Concern    Not on file   Social History Narrative    Not on file     Social Drivers of Health     Financial Resource Strain: Not on file   Food Insecurity: Not on file   Transportation Needs: Not on file   Physical Activity: Not on file   Stress: Not on file   Social Connections: Not on file   Intimate Partner Violence: Not on file   Housing Stability: Not on file      Past Surgical History:   Procedure Laterality Date    CARDIAC ELECTROPHYSIOLOGY MAPPING AND ABLATION  06/29/2021    EYE SURGERY  2022    HYSTERECTOMY, TOTAL ABDOMINAL (CERVIX REMOVED)  2005    KNEE SURGERY  2017    TONSILLECTOMY  1965    UMBILICAL HERNIA REPAIR  2024      Past Medical History:   Diagnosis Date    Atrial fibrillation (HCC)     Hyperlipidemia     Hypertension     Sleep apnea 2015            The patient (or guardian, if applicable) and other individuals in attendance with the patient were advised that Artificial Intelligence will be utilized during this visit to record and process the conversation to generate a clinical note. The patient (or guardian, if applicable) and other individuals in attendance at the appointment consented to the use of AI, including the recording.      An electronic signature was used to authenticate this note.  --Beverley JONETTA Munro, MD Patient needs refills

## 2024-04-10 ENCOUNTER — Encounter

## 2024-04-10 LAB — VITAMIN B12: Vitamin B-12: >2000 pg/mL — ABNORMAL HIGH (ref 232–1245)

## 2024-04-10 LAB — FOLATE: Folate: 5.12 ng/mL (ref 4.80–24.20)

## 2024-04-10 NOTE — Telephone Encounter (Signed)
 Dr. Kandice Orleans spoke with patient referral has been sent to Dr. Wilmot Haskell.

## 2024-04-26 ENCOUNTER — Ambulatory Visit: Admit: 2024-04-26 | Discharge: 2024-04-26 | Payer: Medicare (Managed Care) | Primary: Family Medicine

## 2024-04-26 ENCOUNTER — Encounter

## 2024-04-26 ENCOUNTER — Other Ambulatory Visit: Admit: 2024-04-26 | Discharge: 2024-04-26 | Payer: Medicare (Managed Care) | Primary: Family Medicine

## 2024-04-26 VITALS — BP 117/69 | HR 77 | Temp 96.40000°F | Wt 171.0 lb

## 2024-04-26 DIAGNOSIS — D649 Anemia, unspecified: Secondary | ICD-10-CM

## 2024-04-26 LAB — CBC WITH AUTO DIFFERENTIAL
Basophils %: 0.3 % (ref 0.0–2.0)
Basophils Absolute: 0 10*3/uL (ref 0–2)
Eosinophils %: 1.2 % (ref 0.0–7.0)
Eosinophils Absolute: 0.1 10*3/uL (ref 0.0–0.5)
Hematocrit: 34.9 % (ref 34.0–47.0)
Hemoglobin: 12.1 g/dL (ref 11.5–15.7)
Immature Grans (Abs): 0.02 10*3/uL (ref 0.00–0.06)
Immature Granulocytes %: 0.3 % (ref 0.0–0.6)
Lymphocytes Absolute: 0.4 10*3/uL — ABNORMAL LOW (ref 1.0–3.2)
Lymphocytes: 7.6 % — ABNORMAL LOW (ref 15.0–45.0)
MCH: 35.1 pg — ABNORMAL HIGH (ref 27.0–34.5)
MCHC: 34.7 g/dL (ref 30.0–36.0)
MCV: 101.2 fL — ABNORMAL HIGH (ref 81.0–99.0)
MPV: 9.7 fL (ref 7.0–12.2)
Monocytes %: 15.2 10*3/uL — ABNORMAL HIGH (ref 4.0–12.0)
Monocytes Absolute: 0.9 10*3/uL (ref 0.3–1.0)
Neutrophils %: 75.4 % — ABNORMAL HIGH (ref 42.0–74.0)
Neutrophils Absolute: 4.4 10*3/uL (ref 1.6–7.3)
Platelets: 173 10*3/uL (ref 140–440)
RBC: 3.45 10*3/uL — ABNORMAL LOW (ref 3.60–5.20)
RDW: 13.2 % (ref 10.0–17.0)
WBC: 5.8 10*3/uL (ref 3.8–10.6)

## 2024-04-26 LAB — COMPREHENSIVE METABOLIC PANEL
ALT: 58 U/L — ABNORMAL HIGH (ref 0–42)
AST: 75 U/L — ABNORMAL HIGH (ref 0–46)
Albumin/Globulin Ratio: 1.29 (ref 1.00–2.70)
Albumin: 4.5 g/dL (ref 3.5–5.2)
Alk Phosphatase: 112 U/L (ref 35–117)
Anion Gap: 9 mmol/L (ref 2–17)
BUN: 27 mg/dL — ABNORMAL HIGH (ref 8–23)
CO2: 26 mmol/L (ref 22–29)
Calcium: 9.2 mg/dL (ref 8.8–10.2)
Chloride: 101 mmol/L (ref 98–107)
Creatinine: 1 mg/dL (ref 0.5–1.0)
Est, Glom Filt Rate: 61 mL/min/1.73mÂ² (ref >=60)
Globulin: 3.5 g/dL (ref 1.9–4.4)
Glucose: 115 mg/dL — ABNORMAL HIGH (ref 70–99)
Potassium: 3.6 mmol/L (ref 3.5–5.3)
Sodium: 136 mmol/L (ref 135–145)
Total Bilirubin: 0.71 mg/dL (ref 0.00–1.20)
Total Protein: 8 g/dL (ref 6.4–8.3)

## 2024-04-26 LAB — TSH REFLEX TO FT4: TSH: 4.38 u[IU]/mL — ABNORMAL HIGH (ref 0.358–3.740)

## 2024-04-26 LAB — RETICULOCYTES
RBC: 3.36 x10e6/mcL — ABNORMAL LOW (ref 3.60–5.20)
Retic Ct Abs: 0.0877 /uL (ref 0.0210–0.1080)
Retic Ct Pct: 2.6 % — ABNORMAL HIGH (ref 0.5–2.0)

## 2024-04-26 LAB — T4, FREE: T4 Free: 1.29 ng/dL (ref 0.82–1.70)

## 2024-04-26 LAB — LACTATE DEHYDROGENASE: LD: 221 U/L — ABNORMAL HIGH (ref 135–214)

## 2024-04-26 LAB — FOLATE: Folate: 3.45 ng/mL — ABNORMAL LOW (ref 4.80–24.20)

## 2024-04-26 LAB — BILIRUBIN, DIRECT: Bilirubin, Direct: 0.2 mg/dL (ref 0.00–0.30)

## 2024-04-26 MED ORDER — FOLIC ACID 1 MG PO TABS
1 | ORAL_TABLET | Freq: Every day | ORAL | 5 refills | Status: AC
Start: 2024-04-26 — End: ?

## 2024-04-26 NOTE — Progress Notes (Signed)
 Date:04/26/2024  Patient's Name: Cindy Buck  Age: 73 y.o. (Oct 25, 1951)  Sex: female  MRN: [id number]     Diagnosis and Treatment Information  Microcytic anemia     HPI/Interval History:  Cindy Buck IS A 73 y.o. female     She recently establish care with Dr. Hyson, and is found with a significant macrocytosis and referred here.    She reports that she has been having some recent orthopedic foot issues and for this she saw urgent care.  Their labs were sent and macrocytic anemia is noted with a MCV of up to 110.  She was referred to Dr. Tommie and does have a traumatic area and was sent here.    She has had some fatigue recently and where she can fall asleep frequently and resting, but she is overall active.  No fevers chills or night sweats.  Eating and drinking well.  No recurrent infections necessarily although she did have UTI and sepsis in [place] .    She does say that she has daily alcohol use that we will have 3-5 a day it sounds like, with occasional more on the beach on the weekends  No smoking    No history of cancer no chemotherapy and no radiation exposure no chemical exposure.        ROS, Medical History, Allergies, Current Medications  # A-fib status post multiple ablations  # Fatty liver  A full ROS was performed and negative unless otherwise specified.  Past Medical History, Social History, and Family History reviewed and documented in EMR and elsewhere in note if pertinent.     ONCOLOGIC HISTORY:  Oncology History    No history exists.        PAST MEDICAL HISTORY:  Past Medical History:   Diagnosis Date    Atrial fibrillation (HCC)     Hyperlipidemia     Hypertension     Sleep apnea 2015       PAST SURGICAL HISTORY:  Past Surgical History:   Procedure Laterality Date    CARDIAC ELECTROPHYSIOLOGY MAPPING AND ABLATION  06/29/2021    EYE SURGERY  2022    HYSTERECTOMY, TOTAL ABDOMINAL (CERVIX REMOVED)  2005    KNEE SURGERY  2017    TONSILLECTOMY  1965    UMBILICAL HERNIA REPAIR  2024       HOME  MEDICATIONS:  Current Outpatient Medications   Medication Sig Dispense Refill    metoprolol tartrate (LOPRESSOR) 25 MG tablet Take 0.5 tablets by mouth 2 times daily      estradiol (ESTRACE VAGINAL) 0.1 MG/GM vaginal cream See Instructions, Use a pearl sized amount inside the vagina. Use every night at first for 2 weeks, then  2-3 times a week., # 42.5 g, 3 Refill(s), Signed: 10/17/23 10:21:00 PM MST, Maintenance, Pharmacy: [facility] [phone], Use a pearl sized amount inside the vagina. Use every night at first for 2 weeks, then  2-3 times a week., 163, cm, 10/17/23 13:55:00 MST, Height/Length Measured, 79.4, kg, 10/17/23 13:58:00 MST, Weight Dosing, H/O acute cystitis      tirzepatide-weight management (ZEPBOUND) 5 MG/0.5ML SOLN subCUTAneous injection (VIAL) Inject 0.5 mLs into the skin      MILK THISTLE EXTRACT PO Take by mouth      torsemide (DEMADEX) 20 MG tablet TAKE 1 TABLET BY MOUTH EVERY DAY. OR IF YOU NOTICE WORSENING LEG SWELLING FOR WEIGHT GAIN TKE 2 DAILY FOR 3 DAYS AND CONTACT CARDIOLOGY CLINIC      amiodarone (CORDARONE) 200 MG  tablet Take 1 tablet by mouth daily      acetaminophen (TYLENOL) 325 MG tablet       busPIRone (BUSPAR) 5 MG tablet 1 tablet Orally Twice a day      cetirizine (ZYRTEC) 10 MG tablet 1 tablet Orally Once a day      fluticasone (FLONASE) 50 MCG/ACT nasal spray 1 spray in each nostril Nasally Once a day      magnesium oxide (MAG-OX) 400 MG tablet 1 tablet as needed Orally Once a day for 30 day(s)      rosuvastatin (CRESTOR) 10 MG tablet 1 tablet Orally Once a day      valACYclovir (VALTREX) 1 g tablet 1 tablet Orally every 24 hrs      zolpidem (AMBIEN) 5 MG tablet 1 tablet at bedtime Orally Once a day       No current facility-administered medications for this visit.        ALLERGIES:  Penicillins, Sulfa antibiotics, and Penicillin g    Physical Exam:  BP 117/69 (BP Site: Left Upper Arm, Patient Position: Sitting, BP Cuff Size: Medium Adult)   Pulse 77   Temp (!) 96.4 F  (35.8 C) (Temporal)   Wt 77.6 kg (171 lb)   SpO2 100%   BMI 29.82 kg/m   General:  Well-developed/ weight stable; comfortable; A&O x 3  HEENT:  Normocephalic, atraumatic, anicteric sclera,  no nasal discharge  NECK: no visible goiter  Respiratory: respirations unlabored; no audible wheezing  Cariovascular: no edema  Abdominal: non-distended  Musculoskeletal:  Joints not swollen or erythematous, normal ROM  Skin: no rashes or lesions  Neurological:  grossly intact   Lymph:  No LAD     Assessment  and Plan:  Cindy Buck is a 73 y.o. female     #Macrocytosis  #Folic acid deficiency  She had a fairly significant macrocytosis on labs, however it now appears to have been resolved and much closer to normal.  Anemia appears to be improved as well.  Etiology of this is not entirely clear, and could perhaps be due to an or associate with some alcohol use.  However we will want a keep a close eye on it.  I do not think she needs a bone marrow biopsy now but we will keep that in reserve.  I do have extensive lab workup including flow cytometry pending.  I have sent folate repletion     Follow-up:  Please print AVS  Return in 3-4 months  1.  Labs  2.  Clinic visit    Care checklist:  Access/PORT  Ppx abx  Fertility preservation    Patient Instructions   Your labs (Hb and MCV) are a bit improved.  I have labs pending and we will touch base if there is anything that needs to be addressed.    Otherwise, I will recheck labs in ~3 months      Orders Placed This Encounter    Lactate Dehydrogenase     Standing Status:   Future     Number of Occurrences:   1     Expected Date:   04/26/2024     Expiration Date:   04/26/2025    CBC with Auto Differential     Standing Status:   Future     Number of Occurrences:   1     Expected Date:   04/26/2024     Expiration Date:   04/26/2025    Comprehensive Metabolic Panel  Standing Status:   Future     Number of Occurrences:   1     Expected Date:   04/26/2024     Expiration Date:   04/26/2025     TSH reflex to FT4     Standing Status:   Future     Number of Occurrences:   1     Expected Date:   04/26/2024     Expiration Date:   04/26/2025    Electrophoresis Protein, Serum with Reflex to Immunofixation     Standing Status:   Future     Number of Occurrences:   1     Expected Date:   04/26/2024     Expiration Date:   04/26/2025    KAPPA/LAMBDA LIGHT CHAINS, TOTAL,SERUM     Standing Status:   Future     Number of Occurrences:   1     Expected Date:   04/26/2024     Expiration Date:   04/26/2025    Haptoglobin     Standing Status:   Future     Number of Occurrences:   1     Expected Date:   04/26/2024     Expiration Date:   04/26/2025    Bilirubin, Direct     Standing Status:   Future     Number of Occurrences:   1     Expected Date:   04/26/2024     Expiration Date:   04/26/2025    Flow cytometry     Standing Status:   Future     Number of Occurrences:   1     Expected Date:   04/26/2024     Expiration Date:   04/26/2025    Folate     Standing Status:   Future     Number of Occurrences:   1     Expected Date:   04/26/2024     Expiration Date:   04/26/2025    Retic Count Auto     Standing Status:   Future     Number of Occurrences:   1     Expected Date:   04/26/2024     Expiration Date:   04/26/2025       I have pre-screened the patient's medical records for currently enrolling clinical trials at [facility]. At this time, the patient is []  potentially eligible/ []  not eligible. If deemed eligible, I have discussed the clinical trial with the patient during today's clinical visit. The Research Coordinator will be notified to determine the appropriate next steps.    Future Appointments   Date Time Provider Department Center   08/02/2024  1:15 PM [facility]. Fr   08/02/2024  1:20 PM Anden Bartolo B, MD [facility]. Fr          A total of 45 minutes were spent during today's visit with more than 50% of the time spent in face to face counseling and coordination of care    I. Redell Beer M.D.  Hematology,  Medical Oncology and Cellular Therapy  [facility], [place] ([facility])  [email or web address]    Cc:  Patient Care Team:  Hyson, Beverley BIRCH, MD as PCP - General (Family Medicine)  Hyson, Beverley BIRCH, MD as PCP - Empaneled Provider  No primary care provider on file.       Recent Results (from the past week)   CBC with Auto Differential    Collection Time: 04/26/24 11:00 AM  Result Value Ref Range    WBC 5.8 3.8 - 10.6 x10e3/mcL    RBC 3.45 (L) 3.60 - 5.20 x10e3/mcL    Hemoglobin 12.1 11.5 - 15.7 g/dL    Hematocrit 65.0 65.9 - 47.0 %    MCV 101.2 (H) 81.0 - 99.0 fL    MCH 35.1 (H) 27.0 - 34.5 pg    MCHC 34.7 30.0 - 36.0 g/dL    RDW 86.7 89.9 - 82.9 %    Platelets 173 140 - 440 x10e3/mcL    MPV 9.7 7.0 - 12.2 fL    Neutrophils % 75.4 (H) 42.0 - 74.0 %    Lymphocytes 7.6 (L) 15.0 - 45.0 %    Monocytes % 15.2 (H) 4.0 - 12.0 x10e3/mcL    Eosinophils % 1.2 0.0 - 7.0 %    Basophils % 0.3 0.0 - 2.0 %    Neutrophils Absolute 4.4 1.6 - 7.3 x10e3/mcL    Lymphocytes Absolute 0.4 (L) 1.0 - 3.2 x10e3/mcL    Monocytes Absolute 0.9 0.3 - 1.0 x10e3/mcL    Eosinophils Absolute 0.1 0.0 - 0.5 x10e3/mcL    Basophils Absolute 0 0 - 2 x10e3/mcL    Immature Granulocytes % 0.3 0.0 - 0.6 %    Immature Grans (Abs) 0.02 0.00 - 0.06 x10e3/mcL   Comprehensive Metabolic Panel    Collection Time: 04/26/24 11:00 AM   Result Value Ref Range    Sodium 136 135 - 145 mmol/L    Potassium 3.6 3.5 - 5.3 mmol/L    Chloride 101 98 - 107 mmol/L    CO2 26 22 - 29 mmol/L    Glucose 115 (H) 70 - 99 mg/dL    BUN 27 (H) 8 - 23 mg/dL    Creatinine 1.0 0.5 - 1.0 mg/dL    Anion Gap 9 2 - 17 mmol/L    Calcium 9.2 8.8 - 10.2 mg/dL    Total Protein 8.0 6.4 - 8.3 g/dL    Albumin 4.5 3.5 - 5.2 g/dL    Globulin 3.5 1.9 - 4.4 g/dL    Albumin/Globulin Ratio 1.29 1.00 - 2.70    Total Bilirubin 0.71 0.00 - 1.20 mg/dL    Alk Phosphatase 887 35 - 117 unit/L    AST 75 (H) 0 - 46 unit/L    ALT 58 (H) 0 - 42 unit/L    Est, Glom Filt Rate 61 >=60  mL/min/1.10m   Lactate Dehydrogenase    Collection Time: 04/26/24 11:00 AM   Result Value Ref Range    LD 221 (H) 135 - 214 unit/L   TSH reflex to FT4    Collection Time: 04/26/24 11:00 AM   Result Value Ref Range    TSH 4.380 (H) 0.358 - 3.740 mcIU/mL   Bilirubin, Direct    Collection Time: 04/26/24 11:00 AM   Result Value Ref Range    Bilirubin, Direct 0.20 0.00 - 0.30 mg/dL   Folate    Collection Time: 04/26/24 11:00 AM   Result Value Ref Range    Folate 3.45 (L) 4.80 - 24.20 ng/mL   Retic Count Auto    Collection Time: 04/26/24 11:00 AM   Result Value Ref Range    RBC 3.36 (L) 3.60 - 5.20 x10e6/mcL    Retic Ct Pct 2.6 (H) 0.5 - 2.0 %    Retic Ct Abs 0.0877 0.0210 - 0.1080 /mcL   T4, Free    Collection Time: 04/26/24 11:00 AM   Result Value Ref Range    T4  Free 1.29 0.82 - 1.70 ng/dL

## 2024-04-26 NOTE — Patient Instructions (Signed)
 Your labs (Hb and MCV) are a bit improved.  I have labs pending and we will touch base if there is anything that needs to be addressed.    Otherwise, I will recheck labs in ~3 months

## 2024-04-27 LAB — HAPTOGLOBIN: Haptoglobin: 138 mg/dL (ref 30.0–200.0)

## 2024-04-29 LAB — ELECTROPHORESIS PROTEIN, SERUM WITH REFLEX TO IMMUNOFIXATION
Albumin Electrophoresis: 3.6 g/dL (ref 2.9–4.4)
Albumin/Globulin Ratio: 1.1 (ref 0.7–1.7)
Alpha 1: 0.3 g/dL (ref 0.0–0.4)
Alpha 2: 0.8 g/dL (ref 0.4–1.0)
Beta Globulin: 1.1 g/dL (ref 0.7–1.3)
Gamma Globulin: 1.1 g/dL (ref 0.4–1.8)
Globulin: 3.3 g/dL (ref 2.2–3.9)
Total Protein: 6.9 g/dL (ref 6.0–8.5)

## 2024-04-29 LAB — KAPPA/LAMBDA LIGHT CHAINS, TOTAL, SERUM
Kappa Free Light Chains QNT: 43.7 mg/L — ABNORMAL HIGH (ref 3.3–19.4)
Kappa/Lambda Fluid C Ratio: 0.96 (ref 0.26–1.65)
Lambda Free Light Chains QNT: 45.7 mg/L — ABNORMAL HIGH (ref 5.7–26.3)

## 2024-05-01 LAB — FLOW CYTOMETRY

## 2024-06-04 NOTE — Telephone Encounter (Signed)
 lvm

## 2024-08-02 ENCOUNTER — Encounter: Primary: Family Medicine
